# Patient Record
Sex: Female | Born: 1980 | Race: Black or African American | Hispanic: No | Marital: Married | Smoking: Never smoker
Health system: Southern US, Community
[De-identification: ages and names within clinical notes are randomized; demographics above are authoritative.]

## PROBLEM LIST (undated history)

## (undated) ENCOUNTER — Inpatient Hospital Stay (HOSPITAL_COMMUNITY): Payer: Self-pay

## (undated) DIAGNOSIS — R87629 Unspecified abnormal cytological findings in specimens from vagina: Secondary | ICD-10-CM

## (undated) DIAGNOSIS — R011 Cardiac murmur, unspecified: Secondary | ICD-10-CM

## (undated) HISTORY — DX: Cardiac murmur, unspecified: R01.1

## (undated) HISTORY — PX: NEUROFIBROMA EXCISION: SHX2089

## (undated) HISTORY — PX: LEEP: SHX91

## (undated) HISTORY — PX: OTHER SURGICAL HISTORY: SHX169

---

## 2015-09-19 ENCOUNTER — Encounter: Payer: Self-pay | Admitting: Cardiology

## 2015-09-25 ENCOUNTER — Telehealth: Payer: Self-pay | Admitting: Cardiology

## 2015-09-25 NOTE — Telephone Encounter (Signed)
09/25/2015 Received faxed referral packet from [facility] [place] [facility] for upcoming appointment with Dr. Percival Spanish on 09/28/2015.  Records given to [facility].  cbr

## 2015-09-27 NOTE — Progress Notes (Signed)
Cardiology Office Note   Date:  09/28/2015   ID:  Cynthia Day, DOB Sep 20, 1980, MRN [id number]  PCP:  No PCP Per Patient  Cardiologist:   Minus Breeding, MD   Chief Complaint  Patient presents with  . Heart Murmur      History of Present Illness: Cynthia Day is a 35 y.o. female who presents for evaluation of a murmur. She has no past cardiac history. She was noted recently by her GYN have a heart murmur. She's had no prior cardiac workup and has not been told in the past that she has a murmur. She is quite physically active and exercises routinely. With this she denies any cardiovascular symptoms. The patient denies any new symptoms such as chest discomfort, neck or arm discomfort. There has been no new shortness of breath, PND or orthopnea. There have been no reported palpitations, presyncope or syncope.  Past Medical History  Diagnosis Date  . Murmur     Dr. Charlesetta Garibaldi    Past Surgical History  Procedure Laterality Date  . None       No current outpatient prescriptions on file.   No current facility-administered medications for this visit.    Allergies:   Review of patient's allergies indicates no known allergies.    Social History:  The patient  reports that she has never smoked. She does not have any smokeless tobacco history on file. She reports that she drinks alcohol. She reports that she does not use illicit drugs.   Family History:  The patient's family history includes Coronary artery disease (age of onset: 64) in her father; Hypertension in her mother.    ROS:  Please see the history of present illness.   Otherwise, review of systems are positive for none.   All other systems are reviewed and negative.    PHYSICAL EXAM: VS:  BP 100/64 mmHg  Pulse 61  Ht 5\' 7"  (1.702 m)  Wt 152 lb (68.947 kg)  BMI 23.80 kg/m2 , BMI Body mass index is 23.8 kg/(m^2). GENERAL:  Well appearing HEENT:  Pupils equal round and reactive, fundi not visualized, oral mucosa  unremarkable NECK:  No jugular venous distention, waveform within normal limits, carotid upstroke brisk and symmetric, no bruits, no thyromegaly LYMPHATICS:  No cervical, inguinal adenopathy LUNGS:  Clear to auscultation bilaterally BACK:  No CVA tenderness CHEST:  Unremarkable HEART:  PMI not displaced or sustained,S1 and S2 within normal limits, no S3, no S4, no clicks, no rubs, no murmurs ABD:  Flat, positive bowel sounds normal in frequency in pitch, no bruits, no rebound, no guarding, no midline pulsatile mass, no hepatomegaly, no splenomegaly EXT:  2 plus pulses throughout, no edema, no cyanosis no clubbing SKIN:  No rashes no nodules NEURO:  Cranial nerves II through XII grossly intact, motor grossly intact throughout PSYCH:  Cognitively intact, oriented to person place and time    EKG:  EKG is ordered today. The ekg ordered today demonstrates sinus rhythm, rate 61, axis within normal limits, intervals within normal limits, no acute ST-T wave changes.   Recent Labs: No results found for requested labs within last 365 days.    Lipid Panel No results found for: CHOL, TRIG, HDL, CHOLHDL, VLDL, LDLCALC, LDLDIRECT    Wt Readings from Last 3 Encounters:  09/28/15 152 lb (68.947 kg)      Other studies Reviewed: Additional studies/ records that were reviewed today include: Outside office records. Review of the above records demonstrates:  Please see elsewhere  in the note.     ASSESSMENT AND PLAN:  MURMUR:  Today I do not appreciate a murmur.  I suspect that she may have had a flow murmur. She otherwise has a normal exam and no significant historical findings. I would not suggest further cardiovascular testing is indicated.   Current medicines are reviewed at length with the patient today.  The patient does not have concerns regarding medicines.  The following changes have been made:  no change  Labs/ tests ordered today include:   Orders Placed This Encounter    Procedures  . EKG 12-Lead     Disposition:   FU with me as needed.      Signed, Minus Breeding, MD  09/28/2015 12:35 PM    [facility]

## 2015-09-28 ENCOUNTER — Ambulatory Visit (INDEPENDENT_AMBULATORY_CARE_PROVIDER_SITE_OTHER): Payer: 59 | Admitting: Cardiology

## 2015-09-28 ENCOUNTER — Encounter: Payer: Self-pay | Admitting: Cardiology

## 2015-09-28 VITALS — BP 100/64 | HR 61 | Ht 67.0 in | Wt 152.0 lb

## 2015-09-28 DIAGNOSIS — D489 Neoplasm of uncertain behavior, unspecified: Secondary | ICD-10-CM | POA: Diagnosis not present

## 2015-09-28 DIAGNOSIS — R011 Cardiac murmur, unspecified: Secondary | ICD-10-CM

## 2015-09-28 NOTE — Patient Instructions (Signed)
Your physician recommends that you schedule a follow-up appointment in: As Needed    

## 2015-10-05 DIAGNOSIS — D2362 Other benign neoplasm of skin of left upper limb, including shoulder: Secondary | ICD-10-CM | POA: Diagnosis not present

## 2015-10-05 DIAGNOSIS — D3612 Benign neoplasm of peripheral nerves and autonomic nervous system, upper limb, including shoulder: Secondary | ICD-10-CM | POA: Diagnosis not present

## 2015-12-28 DIAGNOSIS — Z0000 Encounter for general adult medical examination without abnormal findings: Secondary | ICD-10-CM | POA: Diagnosis not present

## 2016-01-08 DIAGNOSIS — Z0000 Encounter for general adult medical examination without abnormal findings: Secondary | ICD-10-CM | POA: Diagnosis not present

## 2016-01-25 DIAGNOSIS — Z30432 Encounter for removal of intrauterine contraceptive device: Secondary | ICD-10-CM | POA: Diagnosis not present

## 2016-05-02 DIAGNOSIS — Z01419 Encounter for gynecological examination (general) (routine) without abnormal findings: Secondary | ICD-10-CM | POA: Diagnosis not present

## 2016-05-02 DIAGNOSIS — Z3040 Encounter for surveillance of contraceptives, unspecified: Secondary | ICD-10-CM | POA: Diagnosis not present

## 2017-03-13 DIAGNOSIS — N979 Female infertility, unspecified: Secondary | ICD-10-CM | POA: Diagnosis not present

## 2017-03-13 DIAGNOSIS — Z113 Encounter for screening for infections with a predominantly sexual mode of transmission: Secondary | ICD-10-CM | POA: Diagnosis not present

## 2017-03-14 ENCOUNTER — Ambulatory Visit (HOSPITAL_COMMUNITY)
Admission: RE | Admit: 2017-03-14 | Discharge: 2017-03-14 | Disposition: A | Discharge status: Home / Self Care | Payer: 59 | Source: Ambulatory Visit | Attending: Obstetrics and Gynecology | Admitting: Obstetrics and Gynecology

## 2017-03-14 ENCOUNTER — Other Ambulatory Visit (HOSPITAL_COMMUNITY): Payer: Self-pay | Admitting: Obstetrics and Gynecology

## 2017-03-14 DIAGNOSIS — Z3141 Encounter for fertility testing: Secondary | ICD-10-CM | POA: Diagnosis not present

## 2017-03-14 DIAGNOSIS — N979 Female infertility, unspecified: Secondary | ICD-10-CM | POA: Insufficient documentation

## 2017-03-14 MED ORDER — IOPAMIDOL (ISOVUE-300) INJECTION 61%
30.0000 mL | Freq: Once | INTRAVENOUS | Status: AC | PRN
  Administered 2017-03-14: 4 mL via INTRAVENOUS

## 2017-04-08 NOTE — L&D Delivery Note (Signed)
Delivery Note At 12:52 PM a viable female was delivered via Vaginal, Spontaneous (Presentation: LOA).  APGAR: 9, 9; weight pending .   Placenta status: Normal appearing and grossly intact.  Cord: Normal, with the following complications:none .  Cord pH: not sent.  Anesthesia: Epidural.   Episiotomy: None Lacerations: 1st degree Perineal; 1cm left labial minora.  Suture Repair: 3.0 vicryl Est. Blood Loss (mL): 270  Mom to postpartum.  Baby to Couplet care / Skin to Skin.  Alinda Dooms, MD.  01/26/2018, 1:31 PM

## 2017-06-12 LAB — OB RESULTS CONSOLE GC/CHLAMYDIA
Chlamydia: NEGATIVE
GC PROBE AMP, GENITAL: NEGATIVE

## 2017-07-03 ENCOUNTER — Encounter (HOSPITAL_COMMUNITY): Payer: Self-pay

## 2017-07-03 LAB — OB RESULTS CONSOLE ABO/RH: RH Type: POSITIVE

## 2017-07-03 LAB — OB RESULTS CONSOLE HIV ANTIBODY (ROUTINE TESTING): HIV: NONREACTIVE

## 2017-07-03 LAB — OB RESULTS CONSOLE RUBELLA ANTIBODY, IGM: Rubella: IMMUNE

## 2017-07-03 LAB — OB RESULTS CONSOLE HEPATITIS B SURFACE ANTIGEN: HEP B S AG: NEGATIVE

## 2017-07-03 LAB — OB RESULTS CONSOLE ANTIBODY SCREEN: ANTIBODY SCREEN: NEGATIVE

## 2017-07-17 ENCOUNTER — Other Ambulatory Visit (HOSPITAL_COMMUNITY): Payer: Self-pay | Admitting: Obstetrics and Gynecology

## 2017-07-17 DIAGNOSIS — Z3686 Encounter for antenatal screening for cervical length: Secondary | ICD-10-CM

## 2017-08-11 ENCOUNTER — Encounter (HOSPITAL_COMMUNITY): Payer: Self-pay | Admitting: *Deleted

## 2017-08-12 ENCOUNTER — Encounter (HOSPITAL_COMMUNITY): Payer: Self-pay

## 2017-08-13 ENCOUNTER — Other Ambulatory Visit (HOSPITAL_COMMUNITY): Payer: Self-pay | Admitting: Obstetrics and Gynecology

## 2017-08-13 ENCOUNTER — Encounter (HOSPITAL_COMMUNITY): Payer: Self-pay

## 2017-08-13 ENCOUNTER — Ambulatory Visit (HOSPITAL_COMMUNITY)
Admission: RE | Admit: 2017-08-13 | Discharge: 2017-08-13 | Disposition: A | Discharge status: Home / Self Care | Payer: No Typology Code available for payment source | Source: Ambulatory Visit | Attending: Obstetrics and Gynecology | Admitting: Obstetrics and Gynecology

## 2017-08-13 DIAGNOSIS — Z3A15 15 weeks gestation of pregnancy: Secondary | ICD-10-CM | POA: Diagnosis not present

## 2017-08-13 DIAGNOSIS — Z9889 Other specified postprocedural states: Secondary | ICD-10-CM | POA: Diagnosis not present

## 2017-08-13 DIAGNOSIS — O3442 Maternal care for other abnormalities of cervix, second trimester: Secondary | ICD-10-CM | POA: Insufficient documentation

## 2017-08-13 DIAGNOSIS — O09522 Supervision of elderly multigravida, second trimester: Secondary | ICD-10-CM

## 2017-08-13 DIAGNOSIS — Z3689 Encounter for other specified antenatal screening: Secondary | ICD-10-CM | POA: Insufficient documentation

## 2017-08-13 DIAGNOSIS — Z3686 Encounter for antenatal screening for cervical length: Secondary | ICD-10-CM

## 2017-08-13 HISTORY — DX: Unspecified abnormal cytological findings in specimens from vagina: R87.629

## 2017-08-14 ENCOUNTER — Ambulatory Visit (HOSPITAL_COMMUNITY): Payer: Self-pay

## 2017-10-13 ENCOUNTER — Other Ambulatory Visit: Payer: Self-pay

## 2017-10-13 ENCOUNTER — Inpatient Hospital Stay (HOSPITAL_COMMUNITY)
Admission: AD | Admit: 2017-10-13 | Discharge: 2017-10-14 | Disposition: A | Discharge status: Home / Self Care | Payer: No Typology Code available for payment source | Source: Ambulatory Visit | Attending: Obstetrics & Gynecology | Admitting: Obstetrics & Gynecology

## 2017-10-13 ENCOUNTER — Encounter (HOSPITAL_COMMUNITY): Payer: Self-pay | Admitting: *Deleted

## 2017-10-13 ENCOUNTER — Inpatient Hospital Stay (HOSPITAL_BASED_OUTPATIENT_CLINIC_OR_DEPARTMENT_OTHER): Payer: No Typology Code available for payment source

## 2017-10-13 DIAGNOSIS — Z3A24 24 weeks gestation of pregnancy: Secondary | ICD-10-CM | POA: Insufficient documentation

## 2017-10-13 DIAGNOSIS — O368120 Decreased fetal movements, second trimester, not applicable or unspecified: Secondary | ICD-10-CM | POA: Diagnosis not present

## 2017-10-13 DIAGNOSIS — O368190 Decreased fetal movements, unspecified trimester, not applicable or unspecified: Secondary | ICD-10-CM

## 2017-10-13 LAB — URINALYSIS, ROUTINE W REFLEX MICROSCOPIC
BILIRUBIN URINE: NEGATIVE
Glucose, UA: NEGATIVE mg/dL
HGB URINE DIPSTICK: NEGATIVE
Ketones, ur: NEGATIVE mg/dL
Leukocytes, UA: NEGATIVE
Nitrite: NEGATIVE
PROTEIN: NEGATIVE mg/dL
Specific Gravity, Urine: 1.003 — ABNORMAL LOW (ref 1.005–1.030)
pH: 6 (ref 5.0–8.0)

## 2017-10-13 NOTE — MAU Note (Signed)
LAST TIME FELT BABY MOVE WAS  AT 10PM.IN TRIAGE - FHR- 142.    FEELS UPPER RIGHT ABD PAIN- STARTED THIS AM.  .   CALLED DR - TOLD  TO COME  IN.

## 2017-10-14 NOTE — MAU Provider Note (Signed)
History     CSN: [id number]  Arrival date & time 10/13/17  2249   None     Chief Complaint  Patient presents with  . Decreased Fetal Movement    Cynthia Day 37 y.o. G1P0 @ [redacted]w[redacted]d presents to MAU after being on vacation in new [place] stating that she has been constipated, having upper right abdominal pain and worried about the baby because she has not felt baby move since this morning   Past Medical History:  Diagnosis Date  . Murmur    Dr. Charlesetta Garibaldi  . Vaginal Pap smear, abnormal     Past Surgical History:  Procedure Laterality Date  . LEEP    . none      Family History  Problem Relation Age of Onset  . Coronary artery disease Father 59  . Hypertension Mother     Social History   Tobacco Use  . Smoking status: Never Smoker  . Smokeless tobacco: Never Used  Substance Use Topics  . Alcohol use: Not Currently    Alcohol/week: 0.0 oz  . Drug use: No    OB History    Gravida  1   Para      Term      Preterm      AB      Living  0     SAB      TAB      Ectopic      Multiple      Live Births              Review of Systems  Gastrointestinal: Positive for abdominal pain.  All other systems reviewed and are negative.   Allergies  Patient has no known allergies.  Home Medications    BP 112/70 (BP Location: Right Arm)   Pulse 94   Temp 97.7 F (36.5 C) (Oral)   Resp (!) 22   Ht 5' 7.5" (1.715 m)   Wt 186 lb 4 oz (84.5 kg)   LMP 04/30/2017   BMI 28.74 kg/m   Physical Exam  Constitutional: She is oriented to person, place, and time. She appears well-developed and well-nourished.  HENT:  Head: Normocephalic.  Neck: Normal range of motion.  Cardiovascular: Normal rate.  Pulmonary/Chest: Effort normal and breath sounds normal.  Abdominal: Soft.  Musculoskeletal: Normal range of motion.  Neurological: She is alert and oriented to person, place, and time.  Skin: Skin is warm and dry.  Psychiatric: She has a normal mood and  affect. Her behavior is normal.  Nursing note and vitals reviewed.  FHr present- Preliminary U/S wnl Results for orders placed or performed during the hospital encounter of 10/13/17 (from the past 24 hour(s))  Urinalysis, Routine w reflex microscopic     Status: Abnormal   Collection Time: 10/13/17 11:14 PM  Result Value Ref Range   Color, Urine STRAW (A) YELLOW   APPearance CLEAR CLEAR   Specific Gravity, Urine 1.003 (L) 1.005 - 1.030   pH 6.0 5.0 - 8.0   Glucose, UA NEGATIVE NEGATIVE mg/dL   Hgb urine dipstick NEGATIVE NEGATIVE   Bilirubin Urine NEGATIVE NEGATIVE   Ketones, ur NEGATIVE NEGATIVE mg/dL   Protein, ur NEGATIVE NEGATIVE mg/dL   Nitrite NEGATIVE NEGATIVE   Leukocytes, UA NEGATIVE NEGATIVE   MAU Course  Procedures (including critical care time)  Labs Reviewed  URINALYSIS, ROUTINE W REFLEX MICROSCOPIC - Abnormal; Notable for the following components:      Result Value   Color, Urine STRAW (*)  Specific Gravity, Urine 1.003 (*)    All other components within normal limits   No results found.   1. Decreased fetal movement       Discharge to home F/U at office at next regular scheduled appointment or prn  [place]

## 2017-11-13 LAB — OB RESULTS CONSOLE RPR: RPR: NONREACTIVE

## 2018-01-12 LAB — OB RESULTS CONSOLE GBS: STREP GROUP B AG: POSITIVE

## 2018-01-25 ENCOUNTER — Encounter (HOSPITAL_COMMUNITY): Payer: Self-pay

## 2018-01-25 ENCOUNTER — Other Ambulatory Visit: Payer: Self-pay

## 2018-01-25 ENCOUNTER — Inpatient Hospital Stay (HOSPITAL_COMMUNITY): Payer: No Typology Code available for payment source | Admitting: Anesthesiology

## 2018-01-25 ENCOUNTER — Inpatient Hospital Stay (HOSPITAL_COMMUNITY)
Admission: AD | Admit: 2018-01-25 | Discharge: 2018-01-28 | DRG: 807 | Disposition: A | Discharge status: Home / Self Care | Payer: No Typology Code available for payment source | Attending: Obstetrics & Gynecology | Admitting: Obstetrics & Gynecology

## 2018-01-25 DIAGNOSIS — Z3A38 38 weeks gestation of pregnancy: Secondary | ICD-10-CM

## 2018-01-25 DIAGNOSIS — Z880 Allergy status to penicillin: Secondary | ICD-10-CM

## 2018-01-25 DIAGNOSIS — Z3483 Encounter for supervision of other normal pregnancy, third trimester: Secondary | ICD-10-CM | POA: Diagnosis present

## 2018-01-25 DIAGNOSIS — O99824 Streptococcus B carrier state complicating childbirth: Secondary | ICD-10-CM | POA: Diagnosis present

## 2018-01-25 DIAGNOSIS — O4290 Premature rupture of membranes, unspecified as to length of time between rupture and onset of labor, unspecified weeks of gestation: Secondary | ICD-10-CM

## 2018-01-25 LAB — CBC
HCT: 38.5 % (ref 36.0–46.0)
HEMOGLOBIN: 13 g/dL (ref 12.0–15.0)
MCH: 29.4 pg (ref 26.0–34.0)
MCHC: 33.8 g/dL (ref 30.0–36.0)
MCV: 87.1 fL (ref 80.0–100.0)
Platelets: 252 10*3/uL (ref 150–400)
RBC: 4.42 MIL/uL (ref 3.87–5.11)
RDW: 13.8 % (ref 11.5–15.5)
WBC: 15.3 10*3/uL — ABNORMAL HIGH (ref 4.0–10.5)
nRBC: 0 % (ref 0.0–0.2)

## 2018-01-25 LAB — TYPE AND SCREEN
ABO/RH(D): A POS
Antibody Screen: NEGATIVE

## 2018-01-25 LAB — POCT FERN TEST: POCT FERN TEST: POSITIVE

## 2018-01-25 MED ORDER — PHENYLEPHRINE 40 MCG/ML (10ML) SYRINGE FOR IV PUSH (FOR BLOOD PRESSURE SUPPORT): 80.0000 ug | PREFILLED_SYRINGE | INTRAVENOUS | Status: DC | PRN

## 2018-01-25 MED ORDER — OXYTOCIN 40 UNITS IN LACTATED RINGERS INFUSION - SIMPLE MED
2.5000 [IU]/h | INTRAVENOUS | Status: DC
  Filled 2018-01-25: qty 1000

## 2018-01-25 MED ORDER — OXYCODONE-ACETAMINOPHEN 5-325 MG PO TABS: 1.0000 | ORAL_TABLET | ORAL | Status: DC | PRN

## 2018-01-25 MED ORDER — LACTATED RINGERS IV SOLN
INTRAVENOUS | Status: DC
  Administered 2018-01-25 – 2018-01-26 (×4): via INTRAVENOUS

## 2018-01-25 MED ORDER — LIDOCAINE HCL (PF) 1 % IJ SOLN
INTRAMUSCULAR | Status: DC | PRN
  Administered 2018-01-25: 8 mL via EPIDURAL

## 2018-01-25 MED ORDER — PHENYLEPHRINE 40 MCG/ML (10ML) SYRINGE FOR IV PUSH (FOR BLOOD PRESSURE SUPPORT)
80.0000 ug | PREFILLED_SYRINGE | INTRAVENOUS | Status: DC | PRN
  Filled 2018-01-25: qty 10
  Filled 2018-01-25: qty 5

## 2018-01-25 MED ORDER — ONDANSETRON HCL 4 MG/2ML IJ SOLN: 4.0000 mg | Freq: Four times a day (QID) | INTRAMUSCULAR | Status: DC | PRN

## 2018-01-25 MED ORDER — LACTATED RINGERS IV SOLN: 500.0000 mL | INTRAVENOUS | Status: DC | PRN

## 2018-01-25 MED ORDER — PHENYLEPHRINE 40 MCG/ML (10ML) SYRINGE FOR IV PUSH (FOR BLOOD PRESSURE SUPPORT)
80.0000 ug | PREFILLED_SYRINGE | INTRAVENOUS | Status: DC | PRN
  Administered 2018-01-25 – 2018-01-26 (×2): 80 ug via INTRAVENOUS
  Filled 2018-01-25: qty 10
  Filled 2018-01-25: qty 5

## 2018-01-25 MED ORDER — PENICILLIN G 3 MILLION UNITS IVPB - SIMPLE MED
3.0000 10*6.[IU] | INTRAVENOUS | Status: DC
  Administered 2018-01-26 (×3): 3 10*6.[IU] via INTRAVENOUS
  Filled 2018-01-25 (×12): qty 100

## 2018-01-25 MED ORDER — EPHEDRINE 5 MG/ML INJ: 10.0000 mg | INTRAVENOUS | Status: DC | PRN

## 2018-01-25 MED ORDER — DIPHENHYDRAMINE HCL 50 MG/ML IJ SOLN: 12.5000 mg | INTRAMUSCULAR | Status: DC | PRN

## 2018-01-25 MED ORDER — OXYCODONE-ACETAMINOPHEN 5-325 MG PO TABS: 2.0000 | ORAL_TABLET | ORAL | Status: DC | PRN

## 2018-01-25 MED ORDER — FENTANYL 2.5 MCG/ML BUPIVACAINE 1/10 % EPIDURAL INFUSION (WH - ANES)
14.0000 mL/h | INTRAMUSCULAR | Status: DC | PRN
  Administered 2018-01-25 – 2018-01-26 (×2): 14 mL/h via EPIDURAL
  Filled 2018-01-25 (×2): qty 100

## 2018-01-25 MED ORDER — LACTATED RINGERS IV SOLN
500.0000 mL | Freq: Once | INTRAVENOUS | Status: AC
  Administered 2018-01-25: 500 mL via INTRAVENOUS

## 2018-01-25 MED ORDER — SODIUM CHLORIDE 0.9 % IV SOLN
5.0000 10*6.[IU] | Freq: Once | INTRAVENOUS | Status: AC
  Administered 2018-01-25: 5 10*6.[IU] via INTRAVENOUS
  Filled 2018-01-25: qty 5

## 2018-01-25 MED ORDER — FENTANYL CITRATE (PF) 100 MCG/2ML IJ SOLN: 100.0000 ug | INTRAMUSCULAR | Status: DC | PRN

## 2018-01-25 MED ORDER — SOD CITRATE-CITRIC ACID 500-334 MG/5ML PO SOLN: 30.0000 mL | ORAL | Status: DC | PRN

## 2018-01-25 MED ORDER — EPHEDRINE 5 MG/ML INJ
10.0000 mg | INTRAVENOUS | Status: DC | PRN
  Filled 2018-01-25: qty 2

## 2018-01-25 MED ORDER — OXYTOCIN BOLUS FROM INFUSION
500.0000 mL | Freq: Once | INTRAVENOUS | Status: AC
  Administered 2018-01-26: 500 mL via INTRAVENOUS

## 2018-01-25 MED ORDER — LIDOCAINE HCL (PF) 1 % IJ SOLN
30.0000 mL | INTRAMUSCULAR | Status: DC | PRN
  Filled 2018-01-25: qty 30

## 2018-01-25 MED ORDER — ACETAMINOPHEN 325 MG PO TABS: 650.0000 mg | ORAL_TABLET | ORAL | Status: DC | PRN

## 2018-01-25 MED ORDER — FENTANYL 2.5 MCG/ML BUPIVACAINE 1/10 % EPIDURAL INFUSION (WH - ANES): 14.0000 mL/h | INTRAMUSCULAR | Status: DC | PRN

## 2018-01-25 MED ORDER — LACTATED RINGERS IV SOLN: 500.0000 mL | Freq: Once | INTRAVENOUS | Status: DC

## 2018-01-25 NOTE — Anesthesia Preprocedure Evaluation (Signed)

## 2018-01-25 NOTE — H&P (Signed)
Cynthia Day is a 37 y.o. female, G1P0000, IUP at 38.5 weeks, presenting for Spontaneous latent labor with SROM, clear. She presents to MAU today complaining of leaking of fluid since 1630. She endorses vaginal bleeding, noting bloody show earlier today. She endorses contractions. Pt endorse + Fm. GBS+. Uneventful pregnancy.   There are no active problems to display for this patient. Pregnancy Problems Group B Streptococcus carrier advanced maternal age gravida history of loop electrosurgical excision procedure  Medications Flonase melatonin Prenatal Vitamin Zyrtec  Medications Prior to Admission  Medication Sig Dispense Refill Last Dose  . Prenatal Vit-Fe Fumarate-FA (PRENATAL VITAMIN PO) Take by mouth.   Past Week at Unknown time    Past Medical History:  Diagnosis Date  . Murmur    Dr. Charlesetta Day  . Vaginal Pap smear, abnormal      No current facility-administered medications on file prior to encounter.    Current Outpatient Medications on File Prior to Encounter  Medication Sig Dispense Refill  . Prenatal Vit-Fe Fumarate-FA (PRENATAL VITAMIN PO) Take by mouth.       No Known Allergies  History of present pregnancy: Pt Info/Preference:  Screening/Consents:  Labs:   EDD: Estimated Date of Delivery: 02/03/18  Establised: Patient's last menstrual period was 04/30/2017.  Anatomy Scan: Date: 09/18/2017 Placenta Location: posterior Genetic Screen: Panoroma:Normal AFP:  First Tri: Quad:  Office: [facility]       First PNV: 11.2 wg Blood Type    Language: english Last PNV: 38.2 wg Rhogam    Flu Vaccine:  UTD   Antibody    TDaP vaccine UTD   GTT: Early: N/A Third Trimester: Normal  Feeding Plan: Breast BTL: No Rubella:    Contraception: ??? VBAC: No RPR:     Circumcision: No-Baby Female   HBsAg:    Pediatrician:  ???   HIV:     Prenatal Classes: No Additional [place]: No GBS:  (For PCN allergy, check sensitivities)       Chlamydia: Neg    MFM Referral/Consult:  GC: Neg  Support  Person: Husband   PAP: 2019-normal  Pain Management: Epidural Neonatologist Referral:  Hgb Electrophoresis:  N/A  Birth Plan: None   Hgb NOB: 13.4    28W: 12.7   OB History    Gravida  1   Para      Term      Preterm      AB      Living  0     SAB      TAB      Ectopic      Multiple      Live Births             Past Medical History:  Diagnosis Date  . Murmur    Dr. Charlesetta Day  . Vaginal Pap smear, abnormal    Past Surgical History:  Procedure Laterality Date  . LEEP    . none     Family History: family history includes Coronary artery disease (age of onset: 19) in her father; Hypertension in her mother. Social History:  reports that she has never smoked. She has never used smokeless tobacco. She reports that she drank alcohol. She reports that she does not use drugs.   Prenatal Transfer Tool  Maternal Diabetes: No Genetic Screening: Normal Maternal Ultrasounds/Referrals: Normal Fetal Ultrasounds or other Referrals:  None Maternal Substance Abuse:  No Significant Maternal Medications:  None Significant Maternal Lab Results: Lab values include: Group B Strep positive  ROS:  Review  of Systems  All other systems reviewed and are negative.    Physical Exam: BP 118/74 (BP Location: Right Arm)   Pulse 93   Temp 97.8 F (36.6 C) (Oral)   Resp 18   Wt 86.9 kg   LMP 04/30/2017   BMI 29.55 kg/m   Physical Exam  Constitutional: She is well-developed, well-nourished, and in no distress.  HENT:  Head: Normocephalic and atraumatic.  Eyes: Pupils are equal, round, and reactive to light. Conjunctivae are normal.  Neck: Normal range of motion. Neck supple.  Cardiovascular: Normal rate and regular rhythm.  Pulmonary/Chest: Effort normal and breath sounds normal.  Abdominal: Soft. Bowel sounds are normal.  Genitourinary: Vagina normal and cervix normal.  Genitourinary Comments: Speculum exam performed by MAU provider.  Uterus soft non-tender, gravida equal  to dates Pelvis: adequate for vaginal delivery.   Nursing note and vitals reviewed.    NST: FHR baseline 140 bpm, Variability: moderate, Accelerations:present, Decelerations:  Absent= Cat 1/Reactive UC:   regular, every 2.5-3 minutes, lasting 60-90 seconds SVE:   Dilation: 1.5 Effacement (%): 60 Station: -3 Exam by:: Cynthia Guild NP , vertex verified by fetal sutures.  Leopold's: Position vertex, EFW 7.5lbs via leopold's.   Labs: Results for orders placed or performed during the hospital encounter of 01/25/18 (from the past 24 hour(s))  POCT fern test     Status: None   Collection Time: 01/25/18  5:36 PM  Result Value Ref Range   POCT Fern Test Positive = ruptured amniotic membanes     Imaging:  No results found.  MAU Course: Orders Placed This Encounter  Procedures  . Contraction - monitoring  . External fetal heart monitoring  . Vaginal exam  . POCT fern test   No orders of the defined types were placed in this encounter.   Assessment/Plan: Cynthia Day is a 37 y.o. female, G1P0000, IUP at 38.5 weeks, presenting for early labor with SROM @ 1630, clear fluids today. GBS+. Pt stable. Uneventful pregnancy.   FWB: Cat 1 Fetal Tracing.   Plan: Admit to [place] per consult with Dr Cynthia Day Routine [facility] orders Pain med/epidural prn PCN G for GBS prophylaxis  Anticipate labor progression   Cynthia Schiller NP-C, [name], [name] 01/25/2018, 5:43 PM

## 2018-01-25 NOTE — MAU Note (Signed)
Ctx today, reports every 8 min, some bloody show, ? SROM, +FM

## 2018-01-25 NOTE — Anesthesia Procedure Notes (Signed)
Epidural Patient location during procedure: OB Start time: 01/25/2018 7:32 PM End time: 01/25/2018 7:39 PM  Staffing Anesthesiologist: Janeece Riggers, MD  Preanesthetic Checklist Completed: patient identified, site marked, surgical consent, pre-op evaluation, timeout performed, IV checked, risks and benefits discussed and monitors and equipment checked  Epidural Patient position: sitting Prep: site prepped and draped and DuraPrep Patient monitoring: continuous pulse ox and blood pressure Approach: midline Location: L4-L5 Injection technique: LOR air  Needle:  Needle type: Tuohy  Needle gauge: 17 G Needle length: 9 cm and 9 Needle insertion depth: 7 cm Catheter type: closed end flexible Catheter size: 19 Gauge Catheter at skin depth: 12 cm Test dose: negative  Assessment Events: blood not aspirated, injection not painful, no injection resistance, negative IV test and no paresthesia

## 2018-01-25 NOTE — MAU Provider Note (Signed)
S: Cynthia Day is a 37 y.o. G1P0 at [redacted]w[redacted]d  who presents to MAU today complaining of leaking of fluid since 1630. She endorses vaginal bleeding, noting bloody show earlier today. She endorses contractions. She reports normal fetal movement.    O: BP 118/74 (BP Location: Right Arm)   Pulse 93   Temp 97.8 F (36.6 C) (Oral)   Resp 18   Wt 86.9 kg   LMP 04/30/2017   BMI 29.55 kg/m  GENERAL: Well-developed, well-nourished female in no acute distress. Patient visibly uncomfortable & breathing through contractions.  HEAD: Normocephalic, atraumatic.  CHEST: Normal effort of breathing, regular heart rate ABDOMEN: Soft, nontender, gravid PELVIC: Normal external female genitalia. Vagina is pink and rugated. Cervix with normal contour, no lesions. Normal discharge.  Positive pooling.   Cervical exam:  Dilation: 1.5 Effacement (%): 60 Cervical Position: Posterior Station: -3 Presentation: Vertex Exam by:: Jorje Guild NP    Fetal Monitoring: Baseline: 135 Variability: moderate Accelerations: 15x15 Decelerations: none Contractions: Q3-4 minutes  Results for orders placed or performed during the hospital encounter of 01/25/18 (from the past 24 hour(s))  POCT fern test     Status: None   Collection Time: 01/25/18  5:36 PM  Result Value Ref Range   POCT Fern Test Positive = ruptured amniotic membanes      A: SIUP at [redacted]w[redacted]d  SROM  P: Report given to RN to contact MD on call for further instructions  Jorje Guild, NP 01/25/2018 5:36 PM

## 2018-01-26 LAB — RPR: RPR Ser Ql: NONREACTIVE

## 2018-01-26 LAB — ABO/RH: ABO/RH(D): A POS

## 2018-01-26 MED ORDER — ONDANSETRON HCL 4 MG PO TABS: 4.0000 mg | ORAL_TABLET | ORAL | Status: DC | PRN

## 2018-01-26 MED ORDER — FLEET ENEMA 7-19 GM/118ML RE ENEM: 1.0000 | ENEMA | Freq: Every day | RECTAL | Status: DC | PRN

## 2018-01-26 MED ORDER — OXYTOCIN 40 UNITS IN LACTATED RINGERS INFUSION - SIMPLE MED
1.0000 m[IU]/min | INTRAVENOUS | Status: DC
  Administered 2018-01-26: 1 m[IU]/min via INTRAVENOUS

## 2018-01-26 MED ORDER — OXYCODONE-ACETAMINOPHEN 5-325 MG PO TABS: 1.0000 | ORAL_TABLET | ORAL | Status: DC | PRN

## 2018-01-26 MED ORDER — COCONUT OIL OIL
1.0000 "application " | TOPICAL_OIL | Status: DC | PRN
  Administered 2018-01-27: 1 via TOPICAL
  Filled 2018-01-26: qty 120

## 2018-01-26 MED ORDER — ACETAMINOPHEN 325 MG PO TABS: 650.0000 mg | ORAL_TABLET | ORAL | Status: DC | PRN

## 2018-01-26 MED ORDER — PRENATAL MULTIVITAMIN CH
1.0000 | ORAL_TABLET | Freq: Every day | ORAL | Status: DC
  Filled 2018-01-26: qty 1

## 2018-01-26 MED ORDER — IBUPROFEN 600 MG PO TABS
600.0000 mg | ORAL_TABLET | Freq: Four times a day (QID) | ORAL | Status: DC
  Administered 2018-01-26 – 2018-01-28 (×7): 600 mg via ORAL
  Filled 2018-01-26 (×8): qty 1

## 2018-01-26 MED ORDER — SENNOSIDES-DOCUSATE SODIUM 8.6-50 MG PO TABS
2.0000 | ORAL_TABLET | ORAL | Status: DC
  Administered 2018-01-26 – 2018-01-27 (×2): 2 via ORAL
  Filled 2018-01-26 (×3): qty 2

## 2018-01-26 MED ORDER — ZOLPIDEM TARTRATE 5 MG PO TABS: 5.0000 mg | ORAL_TABLET | Freq: Every evening | ORAL | Status: DC | PRN

## 2018-01-26 MED ORDER — SIMETHICONE 80 MG PO CHEW: 80.0000 mg | CHEWABLE_TABLET | ORAL | Status: DC | PRN

## 2018-01-26 MED ORDER — DIBUCAINE 1 % RE OINT: 1.0000 "application " | TOPICAL_OINTMENT | RECTAL | Status: DC | PRN

## 2018-01-26 MED ORDER — BISACODYL 10 MG RE SUPP: 10.0000 mg | Freq: Every day | RECTAL | Status: DC | PRN

## 2018-01-26 MED ORDER — BENZOCAINE-MENTHOL 20-0.5 % EX AERO
1.0000 "application " | INHALATION_SPRAY | CUTANEOUS | Status: DC | PRN
  Administered 2018-01-26: 1 via TOPICAL
  Filled 2018-01-26: qty 56

## 2018-01-26 MED ORDER — OXYCODONE-ACETAMINOPHEN 5-325 MG PO TABS: 2.0000 | ORAL_TABLET | ORAL | Status: DC | PRN

## 2018-01-26 MED ORDER — DIPHENHYDRAMINE HCL 25 MG PO CAPS: 25.0000 mg | ORAL_CAPSULE | Freq: Four times a day (QID) | ORAL | Status: DC | PRN

## 2018-01-26 MED ORDER — WITCH HAZEL-GLYCERIN EX PADS: 1.0000 "application " | MEDICATED_PAD | CUTANEOUS | Status: DC | PRN

## 2018-01-26 MED ORDER — TERBUTALINE SULFATE 1 MG/ML IJ SOLN
0.2500 mg | Freq: Once | INTRAMUSCULAR | Status: DC | PRN
  Filled 2018-01-26: qty 1

## 2018-01-26 MED ORDER — ONDANSETRON HCL 4 MG/2ML IJ SOLN: 4.0000 mg | INTRAMUSCULAR | Status: DC | PRN

## 2018-01-26 NOTE — Progress Notes (Signed)
Labor Progress Note  Cynthia Day is a 37 y.o. female, G1P0000, IUP at 38.5 weeks, presenting for Spontaneous latent labor with SROM, clear. She presents to MAU today complaining of leaking of fluid since 1630. Sheendorsesvaginal bleeding, noting bloody show earlier today. Sheendorsescontractions. Pt endorse + Fm. GBS+. Uneventful pregnancy  Subjective: Pt resting in bed in NAD. RN in room checking pt.   Patient Active Problem List   Diagnosis Date Noted  . Normal labor and delivery 01/25/2018   Objective: BP (!) 96/52   Pulse 100   Temp 98 F (36.7 C) (Oral)   Resp 18   Ht 5\' 7"  (1.702 m)   Wt 86.9 kg   LMP 04/30/2017   SpO2 99%   BMI 29.99 kg/m  No intake/output data recorded. No intake/output data recorded. NST: FHR baseline 140 bpm, Variability: moderate, Accelerations:present, Decelerations:  Absent= Cat 1/Reactive CTX:  regular, every 3-4 minutes, lasting 60-80 seconds. Uterus gravid, soft non tender, moderate to palpate with contractions.  SVE:  Dilation: 2.5 Effacement (%): 80 Station: -2 Exam by:: Jack Quarto, RN Pitocin at ( Not started) mUn/min  Assessment:  Cynthia Day is a 37 y.o. female, G1P0000, IUP at 38.5 weeks, presenting for Spontaneous latent labor with SROM, clear. She presents to MAU today complaining of leaking of fluid since 1630. Sheendorsesvaginal bleeding, noting bloody show earlier today. Sheendorsescontractions. Pt endorse + Fm. GBS+. Uneventful pregnancy. Pt resting  and comfortable.  Patient Active Problem List   Diagnosis Date Noted  . Normal labor and delivery 01/25/2018   NICHD: Category 1  Membranes:  SROM @ 0865, clear on 10/20x 8hrs, no s/s of infection  Augmention:   Pitocin - (Not started yet)  Pain management:               Epidural placement:  at 2030 on 10/20  GBS Positive  Abx: Penicillin x1@ 1943 on 10/20, 2nd dose @ 0003 on 10/21  Plan: Continue labor plan Continuous/intermittent monitoring Continue  Penicillin for GBS coverage. Rest/Frequent position changes to facilitate fetal rotation and descent. Will reassess with cervical exam at 1200or earlier if necessary, attempt to decrease cervical checks to decrease transmission of infection. Plan to start pitocin  per protocol 1X1 if contraction space or no cervical change is made. Anticipate labor progression and vaginal delivery.   Noralyn Pick, NP-C, [name], [name] 01/26/2018. 1:13 AM [place]

## 2018-01-26 NOTE — Progress Notes (Signed)
Labor Progress Note  Cynthia Day is a 37 y.o. female, G1P0000, IUP at 38.5 weeks, presenting for Spontaneous latent labor with SROM, clear. She presents to MAU today complaining of leaking of fluid since 1630. Sheendorsesvaginal bleeding, noting bloody show earlier today. Sheendorsescontractions. Pt endorse + Fm. GBS+. Uneventful pregnancy  Subjective: Pt resting in bed post epidural placement and stable.   RN reported slight variables post epidural placement, pt repositioned and resolved, Dr Charlesetta Garibaldi aware.  Patient Active Problem List   Diagnosis Date Noted  . Normal labor and delivery 01/25/2018   Objective: BP (!) 96/52   Pulse 100   Temp 98 F (36.7 C) (Oral)   Resp 18   Ht 5\' 7"  (1.702 m)   Wt 86.9 kg   LMP 04/30/2017   SpO2 99%   BMI 29.99 kg/m  No intake/output data recorded. No intake/output data recorded. NST: FHR baseline 140 bpm, Variability: moderate, Accelerations:present, Decelerations:  Absent= Cat 1/Reactive CTX:  regular, every 3-4 minutes, lasting 60-80 seconds. Uterus gravid, soft non tender, moderate to palpate with contractions.  SVE:  Dilation: 2.5 Effacement (%): 80 Station: -2 Exam by:: Jack Quarto, RN Pitocin at ( Not started) mUn/min  Assessment:  Cynthia Day is a 37 y.o. female, G1P0000, IUP at 38.5 weeks, presenting for Spontaneous latent labor with SROM, clear. She presents to MAU today complaining of leaking of fluid since 1630. Sheendorsesvaginal bleeding, noting bloody show earlier today. Sheendorsescontractions. Pt endorse + Fm. GBS+. Uneventful pregnancy. Pt resting post epidural placement and comfortable.  Patient Active Problem List   Diagnosis Date Noted  . Normal labor and delivery 01/25/2018   NICHD: Category 1  Membranes:  SROM @ 3212, clear on 10/20x 5hrs, no s/s of infection  Augmention:   Pitocin - (Not started yet)  Pain management:               Epidural placement:  at 2030 on 10/20  GBS Positive  Abx:  Penicillin x1  Plan: Continue labor plan Continuous/intermittent monitoring Continue Penicillin for GBS coverage. Rest/Frequent position changes to facilitate fetal rotation and descent. Will reassess with cervical exam at 1200or earlier if necessary, attempt to decrease cervical checks to decrease transmission of infection. Anticipate labor progression and vaginal delivery.   Md Dillard in room.    Noralyn Pick, NP-C, [name], [name] 01/25/2018. 9:00PM

## 2018-01-26 NOTE — Lactation Note (Signed)
This note was copied from a baby's chart. Lactation Consultation Note  Patient Name: Cynthia Day Today's Date: 01/26/2018 Reason for consult: Initial assessment;Primapara;1st time breastfeeding;Early term 37-38.6wks  P1 mother whose infant is now 55 hours old. 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  Offered to assist with latching and mother accepted.  Positioned mother appropriately and latched in the football hold on the right breast without difficulty.  After a couple of minutes baby began rhythmic sucking and mother denied pain with latching.  Showed mother how to do breast compressions and hand expression.  Colostrum container provided for any EBM she may obtain with hand expression.  Encouraged to feed 8-12 times/24 hours or sooner if baby shows feeding cues.  Mother will call for assistance as needed.  [place] given and paperwork put in folder in clean utility room.  Mom made aware of O/P services, breastfeeding support groups, community resources, and our phone # for post-discharge questions. Father and visitors present.     Maternal Data Formula Feeding for Exclusion: No Has patient been taught Hand Expression?: Yes Does the patient have breastfeeding experience prior to this delivery?: No  Feeding Feeding Type: Breast Fed  LATCH Score Latch: Grasps breast easily, tongue down, lips flanged, rhythmical sucking.  Audible Swallowing: A few with stimulation  Type of Nipple: Everted at rest and after stimulation  Comfort (Breast/Nipple): Soft / non-tender  Hold (Positioning): Assistance needed to correctly position infant at breast and maintain latch.  LATCH Score: 8  Interventions Interventions: Breast feeding basics reviewed;Assisted with latch;Skin to skin;Breast massage;Hand express;Position options;Support pillows;Adjust position;Breast compression  Lactation Tools Discussed/Used WIC Program: No   Consult Status Consult  Status: Follow-up Date: 01/27/18 Follow-up type: In-patient    Little Ishikawa 01/26/2018, 6:37 PM

## 2018-01-26 NOTE — Anesthesia Postprocedure Evaluation (Signed)
Anesthesia Post Note  Patient: Cynthia Day  Procedure(s) Performed: AN AD [place]     Patient location during evaluation: Mother Baby Anesthesia Type: Epidural Level of consciousness: awake and alert and oriented Pain management: satisfactory to patient Vital Signs Assessment: post-procedure vital signs reviewed and stable Respiratory status: respiratory function stable Cardiovascular status: stable Postop Assessment: no headache, no backache, epidural receding, patient able to bend at knees, no signs of nausea or vomiting and adequate PO intake Anesthetic complications: no    Last Vitals:  Vitals:   01/26/18 1445 01/26/18 1550  BP: 130/88 128/84  Pulse: (!) 109 96  Resp: 18 20  Temp: 36.8 C 36.6 C  SpO2:      Last Pain:  Vitals:   01/26/18 1615  TempSrc:   PainSc: 0-No pain   Pain Goal: Patients Stated Pain Goal: 5 (01/25/18 1847)               Katherina Mires

## 2018-01-26 NOTE — Progress Notes (Signed)
Labor Progress Note  Cynthia Day is a 37 y.o. female, G1P0000, IUP at 38.5 weeks, presenting for Spontaneous latent labor with SROM, clear. She presents to MAU today complaining of leaking of fluid since 1630. Sheendorsesvaginal bleeding, noting bloody show earlier today. Sheendorsescontractions. Pt endorse + Fm. GBS+. Uneventful pregnancy  Subjective: Pt resting in bed in NAD. RN in room checking pt.  Pt requested pitocin as an augmentation. Strip was looking a little flat, Bolus given.  Patient Active Problem List   Diagnosis Date Noted  . Normal labor and delivery 01/25/2018   Objective: BP 108/67   Pulse 86   Temp 98.3 F (36.8 C) (Oral)   Resp 16   Ht 5\' 7"  (1.702 m)   Wt 86.9 kg   LMP 04/30/2017   SpO2 99%   BMI 29.99 kg/m  No intake/output data recorded. No intake/output data recorded. NST: FHR baseline 130 bpm, Variability: moderate, Accelerations:present 10X10, Decelerations: x1 small variable= Cat 1/Reactive CTX:  regular, every 3-4 minutes, lasting 60 seconds. Uterus gravid, soft non tender, moderate to palpate with contractions.  SVE:  Dilation: 2.5 Effacement (%): 80 Station: -2 Exam by:: Jack Quarto, RN Pitocin at (started @1 ) mUn/min  I discussed with patient risks, benefits and alternatives of labor augmention including higher risk of cesarean delivery compared to spontaneous labor.  We discussed risks of induction agents including effects on fetal heart beat, contraction pattern and need for close monitoring.  Patient expressed understanding of all this and desired to proceed with the augmention. Risks and benefits of augmenting were reviewed, including failure of method, prolonged labor, need for further intervention, risk of cesarean.  Patient and family verbalized understanding and denies any further questions at this time. Pt and family wish to proceed with augmenting process.   Assessment:  Cynthia Day is a 37 y.o. female, G1P0000, IUP at 38.5  weeks, presenting for Spontaneous latent labor with SROM, clear. She presents to MAU today complaining of leaking of fluid since 1630. Sheendorsesvaginal bleeding, noting bloody show earlier today. Sheendorsescontractions. Pt endorse + Fm. GBS+. Uneventful pregnancy. Pt resting  and comfortable. Pt requested pitocin for augmenting due to no cervical change in 6 hours.  Patient Active Problem List   Diagnosis Date Noted  . Normal labor and delivery 01/25/2018   NICHD: Category 1  Membranes:  SROM @ 2440, clear on 10/20x 10hrs, no s/s of infection  Augmention:   Pitocin - (started at 1)  Pain management:               Epidural placement:  at 2030 on 10/20  GBS Positive  Abx: Penicillin x1@ 1943 on 10/20, 2nd dose @ 0003 on 10/21  Plan: Continue labor plan Continuous/intermittent monitoring Continue Penicillin for GBS coverage. Rest/Frequent position changes to facilitate fetal rotation and descent. Will reassess with cervical exam at 0600 or earlier if necessary, attempt to decrease cervical checks to decrease transmission of infection. Start pitocin  per protocol 1X1 . Anticipate labor progression and vaginal delivery.   Noralyn Pick, NP-C, [name], [name] 01/26/2018. 2:12 AM Late Entry

## 2018-01-26 NOTE — Progress Notes (Signed)
Cynthia Day is a 37 y.o. G1P0 at [redacted]w[redacted]d in labor  Subjective: Patient feels some pelvic pressure, but is tolerable.   Objective: BP 120/67   Pulse 97   Temp (!) 97.5 F (36.4 C) (Oral)   Resp 18   Ht 5\' 7"  (1.702 m)   Wt 86.9 kg   LMP 04/30/2017   SpO2 99%   BMI 29.99 kg/m  No intake/output data recorded. Total I/O In: -  Out: 3000 [Urine:3000]  FHT:  FHR: 120 bpm, variability: moderate,  accelerations:  Present,  decelerations:  Absent UC:   regular, every 2 to 3 minutes SVE:   Dilation: 10 Effacement (%): 100 Station: Plus 1 Exam by:: Sandy Haye EFW by Le[place] 7lbs.   Labs: Lab Results  Component Value Date   WBC 15.3 (H) 01/25/2018   HGB 13.0 01/25/2018   HCT 38.5 01/25/2018   MCV 87.1 01/25/2018   PLT 252 01/25/2018    Assessment / Plan: Augmentation of labor, progressing well  Labor: Progressing normally.  Allow laboring down for 1 hr then attempt pushing or sooner.  Preeclampsia:  None Fetal Wellbeing:  Category I Pain Control:  Epidural I/D:  GBS positive on Penicillin Anticipated MOD:  NSVD  Alinda Dooms, MD.  01/26/2018, 9:04 AM

## 2018-01-26 NOTE — Progress Notes (Signed)
[place] with Dr Alesia Richards regarding station of +1 and patient's ineffectiveness of pushing because she does not feel urge to push with contractions.  MD stated to titrate pitocin as needed, and labor down for another hour.  Will call anesthesia MD to determine if able to decrease rate of epidural infusion.

## 2018-01-26 NOTE — Progress Notes (Signed)
Labor Progress Note  Cynthia Day is a 37 y.o. female, G1P0000, IUP at 38.5 weeks, presenting for Spontaneous latent labor with SROM, clear. She presents to MAU today complaining of leaking of fluid since 1630. Sheendorsesvaginal bleeding, noting bloody show earlier today. Sheendorsescontractions. Pt endorse + Fm. GBS+. Uneventful pregnancy  Subjective: Pt resting in bed in NAD. Dr Charlesetta Garibaldi in room performing cervical exam.  Patient Active Problem List   Diagnosis Date Noted  . Normal labor and delivery 01/25/2018   Objective: BP 115/69   Pulse 93   Temp 98 F (36.7 C) (Oral)   Resp 18   Ht 5\' 7"  (1.702 m)   Wt 86.9 kg   LMP 04/30/2017   SpO2 99%   BMI 29.99 kg/m  No intake/output data recorded. No intake/output data recorded. NST: FHR baseline 130 bpm, Variability: moderate, Accelerations:present, Decelerations: absent= Cat 1/Reactive CTX:  regular, every 2-4 minutes, lasting 60-120 seconds. Uterus gravid, soft non tender, moderate to palpate with contractions.  SVE:  Dilation: 5.5 Effacement (%): 100 Station: -1 Exam by:: Dr. Charlesetta Garibaldi Pitocin at (4) mUn/min  Assessment:  Tylisha Willow is a 37 y.o. female, G1P0000, IUP at 38.5 weeks, presenting for Spontaneous latent labor with SROM, clear. She presents to MAU today complaining of leaking of fluid since 1630. Sheendorsesvaginal bleeding, noting bloody show earlier today. Sheendorsescontractions. Pt endorse + Fm. GBS+. Uneventful pregnancy. Pt resting  and comfortable.Dr Charlesetta Garibaldi in room assessing. Pt progressing well and still comfortable with epidural  Patient Active Problem List   Diagnosis Date Noted  . Normal labor and delivery 01/25/2018   NICHD: Category 1  Membranes:  SROM @ 6720, clear on 10/20x 14hrs, no s/s of infection  Augmention:   Pitocin - (4)  Pain management:               Epidural placement:  at 2030 on 10/20  GBS Positive  Abx: Penicillin x1@ 1943 on 10/20, 2nd dose @ 0003 on  10/21  Plan: Continue labor plan Continuous/intermittent monitoring Continue Penicillin for GBS coverage. Rest/Frequent position changes to facilitate fetal rotation and descent. Will reassess with cervical exam when necessary, attempt to decrease cervical checks to decrease transmission of infection. Continue pitocin  per protocol 1X1 . Anticipate labor progression and vaginal delivery.   Noralyn Pick, NP-C, [name], [name] 01/26/2018. 6:39 AM Late Entry

## 2018-01-27 LAB — CBC
HCT: 33.3 % — ABNORMAL LOW (ref 36.0–46.0)
Hemoglobin: 11.3 g/dL — ABNORMAL LOW (ref 12.0–15.0)
MCH: 29.4 pg (ref 26.0–34.0)
MCHC: 33.9 g/dL (ref 30.0–36.0)
MCV: 86.7 fL (ref 80.0–100.0)
PLATELETS: 210 10*3/uL (ref 150–400)
RBC: 3.84 MIL/uL — AB (ref 3.87–5.11)
RDW: 14 % (ref 11.5–15.5)
WBC: 18.5 10*3/uL — AB (ref 4.0–10.5)

## 2018-01-27 NOTE — Progress Notes (Signed)
Subjective: Postpartum Day 1: Vaginal delivery, 1st  laceration Patient up ad lib, reports no syncope or dizziness. Feeding:  breastfeeding Contraceptive plan:  NFP  Objective: Vital signs in last 24 hours: Temp:  [97.6 F (36.4 C)-98.4 F (36.9 C)] 98.1 F (36.7 C) (10/22 0100) Pulse Rate:  [86-126] 86 (10/22 0516) Resp:  [16-20] 16 (10/22 0516) BP: (91-130)/(57-89) 91/57 (10/22 0516)  Physical Exam:  General: alert, cooperative and no distress Lochia: appropriate Uterine Fundus: firm Perineum: healing well DVT Evaluation: No evidence of DVT seen on physical exam.   CBC Latest Ref Rng & Units 01/27/2018 01/25/2018  WBC 4.0 - 10.5 K/uL 18.5(H) 15.3(H)  Hemoglobin 12.0 - 15.0 g/dL 11.3(L) 13.0  Hematocrit 36.0 - 46.0 % 33.3(L) 38.5  Platelets 150 - 400 K/uL 210 252     Assessment/Plan: Status post vaginal delivery day 1. Stable Continue current care. Plan for discharge tomorrow, Breastfeeding and Lactation consult    Pleas Koch ProtheroCNM 01/27/2018, 10:02 AM

## 2018-01-27 NOTE — Lactation Note (Signed)
This note was copied from a baby's chart. Lactation Consultation Note: Mom called to assist with latch. Reports pain when latching to left breast. MD has just finished exam. Assisted with latch in football position. Mom reports she has been using cradle hold mostly on that breast. Mom reports pain of score of 7 with initial latch then eases to a 5. Reviewed keeping baby close to the breast and supporting the breast throughout the feeding. Encouraged to rub EBM into nipples after nursing. Encouragement given. No further questions at present. To call for assist prn. Has Medela pump for home - (Is [employer] employee) and wants to put it together and learn how to use it before DC.  Patient Name: Cynthia Day DXAJO'I Date: 01/27/2018 Reason for consult: Follow-up assessment   Maternal Data Formula Feeding for Exclusion: No Has patient been taught Hand Expression?: Yes Does the patient have breastfeeding experience prior to this delivery?: No  Feeding Feeding Type: Breast Fed  LATCH Score Latch: Grasps breast easily, tongue down, lips flanged, rhythmical sucking.  Audible Swallowing: A few with stimulation  Type of Nipple: Everted at rest and after stimulation  Comfort (Breast/Nipple): Filling, red/small blisters or bruises, mild/mod discomfort  Hold (Positioning): Assistance needed to correctly position infant at breast and maintain latch.  LATCH Score: 7  Interventions Interventions: Breast feeding basics reviewed;Support pillows;Position options;Hand express  Lactation Tools Discussed/Used WIC Program: No   Consult Status Consult Status: Follow-up Date: 01/28/18 Follow-up type: In-patient    Truddie Crumble 01/27/2018, 9:01 AM

## 2018-01-27 NOTE — Plan of Care (Signed)
Patient is progressing well, pain is stable with common soreness with activities and cramping with breastfeeding.  Encouraged mom to void each time before she nurses to help with bleeding and cramping.

## 2018-01-27 NOTE — Plan of Care (Signed)
Pts. Condition will continue to improve 

## 2018-01-28 MED ORDER — IBUPROFEN 600 MG PO TABS: 600.0000 mg | ORAL_TABLET | Freq: Four times a day (QID) | ORAL | 1 refills | Status: DC

## 2018-01-28 NOTE — Lactation Note (Signed)
This note was copied from a baby's chart. Lactation Consultation Note  Patient Name: Cynthia Day XENMM'H Date: 01/28/2018 Reason for consult: Follow-up assessment;Nipple pain/trauma Baby is 27 hours old and at a 7% weight loss.  Baby is currently on the breast in football hold with good depth.  Swallows identified.  Mom still c/o nipple tenderness but not any worse.  Nipples intact and mom using coconut oil and breast shells.  Discussed milk coming to volume and the prevention and treatment of engorgement.  Answered questions.  Lactation outpatient services and support reviewed and encouraged prn.  Maternal Data    Feeding    LATCH Score                   Interventions    Lactation Tools Discussed/Used     Consult Status Consult Status: Complete Follow-up type: Call as needed    Ave Filter 01/28/2018, 10:02 AM

## 2018-01-28 NOTE — Progress Notes (Signed)
Post Partum Day 2 Subjective: no complaints, up ad lib, voiding, tolerating PO and + flatus. Baby breast feeding well. No h/o PMAD  Objective: Blood pressure (!) 93/57, pulse 88, temperature 98.2 F (36.8 C), resp. rate 20, height 5\' 7"  (1.702 m), weight 86.9 kg, last menstrual period 04/30/2017, SpO2 99 %.  Physical Exam:  General: alert and no distress Uterine Fundus: firm, below U Laceration: deferred DVT Evaluation: No evidence of DVT seen on physical exam.  Recent Labs    01/25/18 1810 01/27/18 0541  HGB 13.0 11.3*  HCT 38.5 33.3*    Assessment/Plan:  Pt OOB, moving well without pain, well appearing and happy. Partner at [place] with infant. Will use NFP/condoms for contraception. Plans another baby<3years. Talked about PMAD, s/s infection, breastfeeding tips, and when to call the office.  Discharge home and Breastfeeding    LOS: 3 days   Cynthia Day 01/28/2018, 10:51 AM

## 2018-01-28 NOTE — Discharge Summary (Signed)
OB Discharge Summary     Patient Name: Cynthia Day DOB: 08/16/1980 MRN: [id number]  Date of admission: 01/25/2018 Delivering MD: Waymon Amato   Date of discharge: 01/28/2018  Admitting diagnosis: 38.5 WKS, BLOODY SHOW, CTXS Intrauterine pregnancy: [redacted]w[redacted]d     Secondary diagnosis:  Active Problems:   Normal labor and delivery   Normal vaginal delivery  Additional problems: none     Discharge diagnosis: Term Pregnancy Delivered                                                                                                Post partum procedures:none  Augmentation: Pitocin  Complications: None  Hospital course:  Onset of Labor With Vaginal Delivery     37 y.o. yo G1P0 at [redacted]w[redacted]d was admitted in Latent Labor on 01/25/2018. Patient had an uncomplicated labor course as follows:  Membrane Rupture Time/Date: 4:00 PM ,01/25/2018   Intrapartum Procedures: Episiotomy: None [1]                                         Lacerations:  Perineal [11];Labial [10]  Patient had a delivery of a Viable infant. 01/26/2018  Information for the patient's newborn:  [place], [place] [[id number]]  Delivery Method: Vag-Spont    Pateint had an uncomplicated postpartum course.  She is ambulating, tolerating a regular diet, passing flatus, and urinating well. Patient is discharged home in stable condition on 01/28/18.   Physical exam  Vitals:   01/27/18 0516 01/27/18 1500 01/27/18 2159 01/28/18 0600  BP: (!) 91/57 107/66 108/74 (!) 93/57  Pulse: 86 80 76 88  Resp: 16 19 20 20   Temp:  97.7 F (36.5 C) 97.7 F (36.5 C) 98.2 F (36.8 C)  TempSrc:  Oral    SpO2:   100% 99%  Weight:      Height:        Labs: Lab Results  Component Value Date   WBC 18.5 (H) 01/27/2018   HGB 11.3 (L) 01/27/2018   HCT 33.3 (L) 01/27/2018   MCV 86.7 01/27/2018   PLT 210 01/27/2018   No flowsheet data found.  Discharge instruction: per After Visit Summary and "Baby and Me Booklet".  After visit meds:   Allergies as of 01/28/2018   No Known Allergies     Medication List    TAKE these medications   fluticasone 50 MCG/ACT nasal spray Commonly known as:  FLONASE Place 2 sprays into both nostrils daily.   ibuprofen 600 MG tablet Commonly known as:  ADVIL,MOTRIN Take 1 tablet (600 mg total) by mouth every 6 (six) hours.   MELATONIN PO Take 1 tablet by mouth at bedtime.   PRENATAL VITAMIN PO Take by mouth.       Diet: routine diet  Activity: Advance as tolerated. Pelvic rest for 6 weeks.   Outpatient follow up:4 weeks Follow up Appt: Future Appointments  Date Time Provider [place]  06/11/2018  9:15 AM Glendale Chard, MD [facility] None   Follow up Visit:No  follow-ups on file.  Postpartum contraception: Natural Family Planning, Condoms  Newborn Data: Live born female  Birth Weight: 8 lb 0.6 oz (3645 g) APGAR: 9, 9  Newborn Delivery   Birth date/time:  01/[birth year]/2018 12:52:00 Delivery type:  Vaginal, Spontaneous     Baby Feeding: breast Disposition:home with mother   01/28/2018 Violet Baldy Casondra Gasca, [name]

## 2018-03-16 ENCOUNTER — Other Ambulatory Visit: Payer: Self-pay

## 2018-03-16 DIAGNOSIS — Z0100 Encounter for examination of eyes and vision without abnormal findings: Secondary | ICD-10-CM

## 2018-04-28 ENCOUNTER — Encounter (HOSPITAL_COMMUNITY): Payer: Self-pay

## 2018-05-07 ENCOUNTER — Ambulatory Visit: Payer: Self-pay

## 2018-05-07 NOTE — Lactation Note (Signed)
This note was copied from a baby's chart. 05/07/2018  Name: Cynthia Day MRN: [id number] Date of Birth: 01/26/2017 Gestational Age: Gestational Age: [redacted]w[redacted]d Birth Weight: 128.6 oz Weight today:    15 pounds 0.9 ounces (6830 grams) with clean size 1 diaper    63 month old infant presents today with mom for feeding assessment. Mom has developed a milk bleb to the center of the right nipple for the last month. Mom reports infant clicks on the right breast and it started in the last month and a half.   Infant self awakens every 2-4 hours to feed. She generally feeds on one breast per feeding. Mom pumps once a day for dad to feed infant a bottle at night. Infant drools in the Medela bottle and does better on the Mam nipple. Mom is aware of paced bottle feeding.   Mom has had a milk bleb on her right nipple that has been occurring for the last month. Mom using warm compresses, Olive Oil Coconut oil, Lanolin to relieve bleb. Mom reports it was better and then got worse when she went out of town and was not able to use the warm compresses and keep up the treatment. The bleb is currently opened. Discussed APNO with mom and that it may be helpful to help her heal.   Infant fed on the right breast for the feeding for about 10 minutes, nipple was asymmetrical and compressed post feeding. Mom with pain with initial latch that improved with feeding. Infant clicking and some choking with feeding. Mom reports infant usually does not choke on the breast. Mom reports infant drools on the Medela bottles but does ok on the Mam bottles.   Mom reports she did not notice if her nipple was asymmetrical prior to the last few weeks. Mom reports the clicking started in the last 1.5 months with infant. Infant is sometimes distracted and pulls on and off the breast with some head turning also.   Infant to follow up with Lactation as needed. Mom to call with any questions as needed. 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     General Information: Mother's reason for visit: nipple pain to right nipple Consult: Initial Lactation consultant: Nonah Mattes RN,IBCLC Breastfeeding experience: self awakens to feed every 2-4 hours   Maternal medications: Pre-natal vitamin, Other(Lecithin 12oo mg TID, Elderberry)  Breastfeeding History: Frequency of breast feeding: every 2-4 hours Duration of feeding: 10-15 minutes  Supplementation: Supplement method: bottle(Mam, Medela)         Breast milk volume: 2-3 ounces Breast milk frequency: 1x a day   Pump type: Medela pump in style Pump frequency: once a day Pump volume: 2-5 ounces  Infant Output Assessment: Voids per 24 hours: 8 Urine color: Clear yellow Stools per 24 hours: 3-4 Stool color: Yellow  Breast Assessment: Breast: Soft, Compressible Nipple: Erect, Blister(right nipple) Pain level: 8(with the initial latch and improves with feeding) Pain interventions: Bra, Coconut oil, Expressed breast milk  Feeding Assessment: Infant oral assessment: Variance Infant oral assessment comment: Infant latched to the right breast. Infant active and pulling on and off the breast at times. Infant clicking at the breast. Infant with thin labial frenulum that inserts at the bottom of the gum ridge, upper lip is tight with flanging. Infant with sucking blister to center upper lip. Infant with posterior lingual frenulum noted. Infant with good tongue lateralization and extension. Infant  with some limited mid tongue elevation. Infant with hump to the back of the tongue with suckling on gloved finger. Infant with strong suckle on gloved finger, some tongue thrusting noted. Mom give information on tongue and lip restrictions and how they can effect milk supply and milk production. Infant is gaining well and mom's milk supply is good. mom to research and speak with dad to determine if they want to  have infant evaluated.  Positioning: Cradle(right breast) Latch: 2 - Grasps breast easily, tongue down, lips flanged, rhythmical sucking. Audible swallowing: 2 - Spontaneous and intermittent Type of nipple: 2 - Everted at rest and after stimulation Comfort: 1 - Filling, red/small blisters or bruises, mild/mod discomfort Hold: 2 - No assistance needed to correctly position infant at breast LATCH score: 9 Latch assessment: Shallow Lips flanged: Yes Suck assessment: Displays both   Pre-feed weight: 6830 grams Post feed weight: 6924 grams Amount transferred: 94 ml Amount supplemented: 0  Additional Feeding Assessment:                                    Totals: Total amount transferred: 94 ml Total supplement given: 0 Total amount pumped post feed: did not pump   Plan:  1. Offer infant the breast with feeding cues 2. Empty the first before offering the second breast 3. Sunflower Lecithin can be helpful to decrease milk blebs. Lecithin dosage is usually 1200 mg QID and then decrease to 2 x a day. 4. All Purpose Nipple Ointment may be help to heal your right nipple 5. Keep up the good work 6. Thank you for allowing me to assist you today 7. Please call with any questions/concerns as needed [phone] 8. Follow up with Lactation as needed   Donn Pierini RN, IBCLC                                                        Donn Pierini 05/07/2018, 3:37 PM

## 2018-06-08 ENCOUNTER — Telehealth: Payer: Self-pay | Admitting: General Practice

## 2018-06-08 NOTE — Telephone Encounter (Signed)
PT CALLED TO CANCEL DUE TO SHE IS IN [facility] NETWORK UNDER INSURANCE. CALLED PT TO CONFIRM CANCELLATION NO ANS UNABLE TO LVM

## 2018-06-11 ENCOUNTER — Encounter: Payer: Self-pay | Admitting: Internal Medicine

## 2019-04-09 NOTE — L&D Delivery Note (Signed)
Delivery Note At 2:09 AM a viable and healthy female was delivered via SVD (Presentation: Right Occiput Anterior).   APGAR: 9, 9;  weight pending Placenta status: Spontaneous, Intact to pathology Cord: 3 vessels with the following complications: None.    Anesthesia: Epidural Episiotomy: None Lacerations: None Suture Repair: NA Est. Blood Loss (mL):  100 mL  Pt does desire inpatient circumcision  Mom to postpartum.  Baby to Couplet care / Skin to Skin.  Anderson Malta B Erandy Mceachern 01/02/2020, 2:27 AM

## 2019-07-28 ENCOUNTER — Other Ambulatory Visit (HOSPITAL_COMMUNITY): Payer: Self-pay | Admitting: Obstetrics and Gynecology

## 2019-07-28 DIAGNOSIS — O09522 Supervision of elderly multigravida, second trimester: Secondary | ICD-10-CM

## 2019-07-28 DIAGNOSIS — Z3689 Encounter for other specified antenatal screening: Secondary | ICD-10-CM

## 2019-07-28 DIAGNOSIS — Z3A20 20 weeks gestation of pregnancy: Secondary | ICD-10-CM

## 2019-08-16 ENCOUNTER — Encounter: Payer: Self-pay | Admitting: *Deleted

## 2019-08-18 ENCOUNTER — Ambulatory Visit: Payer: No Typology Code available for payment source

## 2019-10-03 ENCOUNTER — Inpatient Hospital Stay (HOSPITAL_COMMUNITY)
Admission: AD | Admit: 2019-10-03 | Discharge: 2019-10-04 | Disposition: A | Discharge status: Home / Self Care | Payer: PRIVATE HEALTH INSURANCE | Attending: Obstetrics and Gynecology | Admitting: Obstetrics and Gynecology

## 2019-10-03 ENCOUNTER — Encounter (HOSPITAL_COMMUNITY): Payer: Self-pay | Admitting: Obstetrics and Gynecology

## 2019-10-03 ENCOUNTER — Other Ambulatory Visit: Payer: Self-pay

## 2019-10-03 DIAGNOSIS — B9689 Other specified bacterial agents as the cause of diseases classified elsewhere: Secondary | ICD-10-CM | POA: Diagnosis not present

## 2019-10-03 DIAGNOSIS — O4692 Antepartum hemorrhage, unspecified, second trimester: Secondary | ICD-10-CM | POA: Diagnosis present

## 2019-10-03 DIAGNOSIS — O99612 Diseases of the digestive system complicating pregnancy, second trimester: Secondary | ICD-10-CM | POA: Diagnosis not present

## 2019-10-03 DIAGNOSIS — Z3689 Encounter for other specified antenatal screening: Secondary | ICD-10-CM

## 2019-10-03 DIAGNOSIS — O4690 Antepartum hemorrhage, unspecified, unspecified trimester: Secondary | ICD-10-CM

## 2019-10-03 DIAGNOSIS — K5900 Constipation, unspecified: Secondary | ICD-10-CM | POA: Insufficient documentation

## 2019-10-03 DIAGNOSIS — O09522 Supervision of elderly multigravida, second trimester: Secondary | ICD-10-CM | POA: Insufficient documentation

## 2019-10-03 DIAGNOSIS — Z3A27 27 weeks gestation of pregnancy: Secondary | ICD-10-CM | POA: Diagnosis not present

## 2019-10-03 DIAGNOSIS — O23592 Infection of other part of genital tract in pregnancy, second trimester: Secondary | ICD-10-CM | POA: Diagnosis not present

## 2019-10-03 NOTE — MAU Provider Note (Signed)
History     CSN: [id number]  Arrival date and time: 10/03/19 2244   First Provider Initiated Contact with Patient 10/03/19 2321      Chief Complaint  Patient presents with  . Vaginal Bleeding   Cynthia Day is a 39 y.o. G2P1 at [redacted]w[redacted]d who presents to MAU with complaints of vaginal bleeding. Patient reports getting out of the shower tonight and noticing bright red vaginal bleeding on the towel. Patient reports that she continued to wipe and bleeding continued. She denies passing any clots. She denies recent IC in the past 24-48 hours. She denies vaginal discharge prior to the bleeding starting. She denies abdominal pain, cramping or contractions. Patient reports earlier having constipation pain that was intermittent but no contraction like pain. Patient denies BM today and she denies straining. +FM.   OB History    Gravida  2   Para  1   Term  1   Preterm      AB      Living  1     SAB      TAB      Ectopic      Multiple      Live Births  1           Past Medical History:  Diagnosis Date  . Murmur    Dr. Charlesetta Garibaldi  . Vaginal Pap smear, abnormal     Past Surgical History:  Procedure Laterality Date  . LEEP    . none      Family History  Problem Relation Age of Onset  . Coronary artery disease Father 49  . Hypertension Mother     Social History   Tobacco Use  . Smoking status: Never Smoker  . Smokeless tobacco: Never Used  Substance Use Topics  . Alcohol use: Not Currently    Alcohol/week: 0.0 standard drinks  . Drug use: No    Allergies: No Known Allergies  Medications Prior to Admission  Medication Sig Dispense Refill Last Dose  . cetirizine (ZYRTEC) 10 MG chewable tablet Chew 10 mg by mouth daily.   10/02/2019 at Unknown time  . fluticasone (FLONASE) 50 MCG/ACT nasal spray Place 2 sprays into both nostrils daily.   10/02/2019 at Unknown time  . Prenatal Vit-Fe Fumarate-FA (PRENATAL VITAMIN PO) Take by mouth.   10/02/2019 at Unknown time     Review of Systems  Constitutional: Negative.   Respiratory: Negative.   Cardiovascular: Negative.   Gastrointestinal: Negative.   Genitourinary: Positive for vaginal bleeding. Negative for difficulty urinating, dysuria, frequency, hematuria, urgency and vaginal discharge.  Musculoskeletal: Negative.   Neurological: Negative.   Psychiatric/Behavioral: Negative.    Physical Exam   Blood pressure 113/70, pulse (!) 101, temperature 98 F (36.7 C), temperature source Oral, resp. rate 18, last menstrual period 03/29/2019, SpO2 100 %, unknown if currently breastfeeding.  Physical Exam Exam conducted with a chaperone present.  HENT:     Head: Normocephalic.  Cardiovascular:     Rate and Rhythm: Normal rate and regular rhythm.  Pulmonary:     Effort: Pulmonary effort is normal. No respiratory distress.     Breath sounds: Normal breath sounds. No wheezing.  Abdominal:     Palpations: Abdomen is soft. There is no mass.     Tenderness: There is no abdominal tenderness. There is no guarding.     Comments: Gravid appropriate for gestational age, no contractions palpated   Genitourinary:    Exam position: Lithotomy position.     Vagina:  Vaginal discharge present. No bleeding.     Comments: Pelvic exam: Cervix pink, visually open to 1cm, without lesion, moderate white creamy discharge, no vaginal bleeding present, vaginal walls and external genitalia normal Skin:    General: Skin is warm and dry.  Neurological:     Mental Status: She is alert and oriented to person, place, and time.  Psychiatric:        Mood and Affect: Mood normal.        Behavior: Behavior normal.        Thought Content: Thought content normal.    Patient had recent anatomy US on 09/10/19- normal anatomy US with posterior placenta, cervical examination performed today in MAU d/t no vaginal bleeding on examination and posterior placenta on anatomy US.   Dilation: Fingertip Effacement (%): Thick Cervical Position:  Posterior Exam by:: V Allie Gerhold [name]  Fetal monitoring:  FHR- 145/ moderate/+accels (10x10)/ no decelerations  TOCO- 1 UC    MAU Course  Procedures  MDM Orders Placed This Encounter  Procedures  . Wet prep, genital  . [place] MFM OB LIMITED  . Fetal fibronectin   Labs reviewed:  Results for orders placed or performed during the hospital encounter of 10/03/19 (from the past 24 hour(s))  Wet prep, genital     Status: Abnormal   Collection Time: 10/03/19 11:39 PM  Result Value Ref Range   Yeast Wet Prep HPF POC NONE SEEN NONE SEEN   Trich, Wet Prep NONE SEEN NONE SEEN   Clue Cells Wet Prep HPF POC PRESENT (A) NONE SEEN   WBC, Wet Prep HPF POC MANY (A) NONE SEEN   Sperm NONE SEEN   Fetal fibronectin     Status: None   Collection Time: 10/03/19 11:51 PM  Result Value Ref Range   Fetal Fibronectin NEGATIVE NEGATIVE   [place] report reviewed:  - fundal placenta, no signs of previa or abruption - cephalic presentation - AFV WNL  - FHR 150  Reviewed labs and ultrasound with patient. Discussed with patient diagnoses of BV based on physical examination and presence of clue cells. Reviewed pelvic for 7 days after occurrence of vaginal bleeding. Rx for Flagyl sent to pharmacy of choice. RH pos, no Rhogam needed.    Discussed reasons to return to MAU. Follow up as scheduled in the office. Return to MAU as needed. Pt stable at time of discharge.   Assessment and Plan   1. Bacterial vaginosis   2. Vaginal bleeding during pregnancy   3. [redacted] weeks gestation of pregnancy   4. NST (non-stress test) reactive    Discharge home Follow up as scheduled in the office for prenatal care Return to MAU as needed for reasons discussed and/or emergencies  Rx for flagyl  NST reactive and reassuring for gestational age  No vaginal bleeding on examination, pelvic rest and hydration    Follow-up Information    [facility] [place]. Go on 10/11/2019.   Specialty: Obstetrics and Gynecology Why: Follow up as  scheduled for prenatal care and return to MAU as needed Contact information: [address]. [address] [place] [address] [phone]              Allergies as of 10/04/2019   No Known Allergies     Medication List    TAKE these medications   cetirizine 10 MG chewable tablet Commonly known as: ZYRTEC Chew 10 mg by mouth daily.   fluticasone 50 MCG/ACT nasal spray Commonly known as: FLONASE Place 2 sprays into  both nostrils daily.   metroNIDAZOLE 500 MG tablet Commonly known as: FLAGYL Take 1 tablet (500 mg total) by mouth 2 (two) times daily.   PRENATAL VITAMIN PO Take by mouth.       Lajean Manes [name] 10/04/2019, 4:31 AM

## 2019-10-03 NOTE — MAU Note (Signed)
Patient reports to MAU for VB that happened when she was drying off after a shower. The blood was bright in color and kept coming as she wiped repeatedly. +movement

## 2019-10-04 ENCOUNTER — Inpatient Hospital Stay (HOSPITAL_BASED_OUTPATIENT_CLINIC_OR_DEPARTMENT_OTHER): Payer: PRIVATE HEALTH INSURANCE

## 2019-10-04 DIAGNOSIS — Z3A27 27 weeks gestation of pregnancy: Secondary | ICD-10-CM | POA: Diagnosis not present

## 2019-10-04 DIAGNOSIS — O4692 Antepartum hemorrhage, unspecified, second trimester: Secondary | ICD-10-CM | POA: Diagnosis not present

## 2019-10-04 DIAGNOSIS — B9689 Other specified bacterial agents as the cause of diseases classified elsewhere: Secondary | ICD-10-CM

## 2019-10-04 DIAGNOSIS — O99891 Other specified diseases and conditions complicating pregnancy: Secondary | ICD-10-CM

## 2019-10-04 DIAGNOSIS — O4693 Antepartum hemorrhage, unspecified, third trimester: Secondary | ICD-10-CM

## 2019-10-04 DIAGNOSIS — N760 Acute vaginitis: Secondary | ICD-10-CM

## 2019-10-04 LAB — WET PREP, GENITAL
Sperm: NONE SEEN
Trich, Wet Prep: NONE SEEN
Yeast Wet Prep HPF POC: NONE SEEN

## 2019-10-04 LAB — GC/CHLAMYDIA PROBE AMP (~~LOC~~) NOT AT ARMC
Chlamydia: NEGATIVE
Comment: NEGATIVE
Comment: NORMAL
Neisseria Gonorrhea: NEGATIVE

## 2019-10-04 LAB — FETAL FIBRONECTIN: Fetal Fibronectin: NEGATIVE

## 2019-10-04 MED ORDER — METRONIDAZOLE 500 MG PO TABS: 500.0000 mg | ORAL_TABLET | Freq: Two times a day (BID) | ORAL | 0 refills | Status: DC

## 2019-10-04 NOTE — Discharge Instructions (Signed)
Pelvic rest for the next 7 days

## 2019-12-30 ENCOUNTER — Telehealth (HOSPITAL_COMMUNITY): Payer: Self-pay | Admitting: *Deleted

## 2019-12-31 ENCOUNTER — Other Ambulatory Visit: Payer: Self-pay | Admitting: Obstetrics and Gynecology

## 2019-12-31 ENCOUNTER — Encounter (HOSPITAL_COMMUNITY): Payer: Self-pay | Admitting: *Deleted

## 2019-12-31 NOTE — Telephone Encounter (Signed)
Preadmission screen  

## 2020-01-01 ENCOUNTER — Inpatient Hospital Stay (HOSPITAL_COMMUNITY): Payer: PRIVATE HEALTH INSURANCE | Admitting: Anesthesiology

## 2020-01-01 ENCOUNTER — Inpatient Hospital Stay (HOSPITAL_COMMUNITY)
Admission: AD | Admit: 2020-01-01 | Discharge: 2020-01-03 | DRG: 807 | Disposition: A | Discharge status: Home / Self Care | Payer: PRIVATE HEALTH INSURANCE | Attending: Obstetrics & Gynecology | Admitting: Obstetrics & Gynecology

## 2020-01-01 ENCOUNTER — Other Ambulatory Visit: Payer: Self-pay

## 2020-01-01 ENCOUNTER — Encounter (HOSPITAL_COMMUNITY): Payer: Self-pay | Admitting: Obstetrics and Gynecology

## 2020-01-01 DIAGNOSIS — O26893 Other specified pregnancy related conditions, third trimester: Secondary | ICD-10-CM | POA: Diagnosis present

## 2020-01-01 DIAGNOSIS — O479 False labor, unspecified: Secondary | ICD-10-CM

## 2020-01-01 DIAGNOSIS — Z20822 Contact with and (suspected) exposure to covid-19: Secondary | ICD-10-CM | POA: Diagnosis present

## 2020-01-01 DIAGNOSIS — Z3A39 39 weeks gestation of pregnancy: Secondary | ICD-10-CM | POA: Diagnosis not present

## 2020-01-01 DIAGNOSIS — E669 Obesity, unspecified: Secondary | ICD-10-CM | POA: Diagnosis present

## 2020-01-01 DIAGNOSIS — O99214 Obesity complicating childbirth: Secondary | ICD-10-CM | POA: Diagnosis present

## 2020-01-01 LAB — CBC
HCT: 39 % (ref 36.0–46.0)
Hemoglobin: 12.4 g/dL (ref 12.0–15.0)
MCH: 27.7 pg (ref 26.0–34.0)
MCHC: 31.8 g/dL (ref 30.0–36.0)
MCV: 87.2 fL (ref 80.0–100.0)
Platelets: 207 10*3/uL (ref 150–400)
RBC: 4.47 MIL/uL (ref 3.87–5.11)
RDW: 14 % (ref 11.5–15.5)
WBC: 11.5 10*3/uL — ABNORMAL HIGH (ref 4.0–10.5)
nRBC: 0 % (ref 0.0–0.2)

## 2020-01-01 LAB — TYPE AND SCREEN
ABO/RH(D): A POS
Antibody Screen: NEGATIVE

## 2020-01-01 LAB — RESPIRATORY PANEL BY RT PCR (FLU A&B, COVID)
Influenza A by PCR: NEGATIVE
Influenza B by PCR: NEGATIVE
SARS Coronavirus 2 by RT PCR: NEGATIVE

## 2020-01-01 MED ORDER — OXYTOCIN BOLUS FROM INFUSION
333.0000 mL | Freq: Once | INTRAVENOUS | Status: AC
  Administered 2020-01-02: 333 mL via INTRAVENOUS

## 2020-01-01 MED ORDER — LACTATED RINGERS IV SOLN
500.0000 mL | Freq: Once | INTRAVENOUS | Status: AC
  Administered 2020-01-01: 500 mL via INTRAVENOUS

## 2020-01-01 MED ORDER — OXYTOCIN-SODIUM CHLORIDE 30-0.9 UT/500ML-% IV SOLN
1.0000 m[IU]/min | INTRAVENOUS | Status: DC
  Administered 2020-01-01: 2 m[IU]/min via INTRAVENOUS
  Filled 2020-01-01: qty 500

## 2020-01-01 MED ORDER — BUTORPHANOL TARTRATE 1 MG/ML IJ SOLN
1.0000 mg | INTRAMUSCULAR | Status: DC | PRN
  Administered 2020-01-01: 1 mg via INTRAVENOUS
  Filled 2020-01-01: qty 1

## 2020-01-01 MED ORDER — LIDOCAINE HCL (PF) 1 % IJ SOLN: 30.0000 mL | INTRAMUSCULAR | Status: DC | PRN

## 2020-01-01 MED ORDER — OXYTOCIN-SODIUM CHLORIDE 30-0.9 UT/500ML-% IV SOLN
2.5000 [IU]/h | INTRAVENOUS | Status: DC
  Administered 2020-01-02: 2.5 [IU]/h via INTRAVENOUS

## 2020-01-01 MED ORDER — FENTANYL-BUPIVACAINE-NACL 0.5-0.125-0.9 MG/250ML-% EP SOLN
12.0000 mL/h | EPIDURAL | Status: DC | PRN
  Filled 2020-01-01: qty 250

## 2020-01-01 MED ORDER — EPHEDRINE 5 MG/ML INJ: 10.0000 mg | INTRAVENOUS | Status: DC | PRN

## 2020-01-01 MED ORDER — FENTANYL CITRATE (PF) 100 MCG/2ML IJ SOLN
INTRAMUSCULAR | Status: AC
  Filled 2020-01-01: qty 2

## 2020-01-01 MED ORDER — LIDOCAINE HCL (PF) 1 % IJ SOLN
INTRAMUSCULAR | Status: DC | PRN
  Administered 2020-01-01: 10 mL via EPIDURAL
  Administered 2020-01-01: 2 mL via EPIDURAL

## 2020-01-01 MED ORDER — ONDANSETRON HCL 4 MG/2ML IJ SOLN
4.0000 mg | Freq: Four times a day (QID) | INTRAMUSCULAR | Status: DC | PRN
  Administered 2020-01-01: 4 mg via INTRAVENOUS
  Filled 2020-01-01: qty 2

## 2020-01-01 MED ORDER — ACETAMINOPHEN 325 MG PO TABS: 650.0000 mg | ORAL_TABLET | ORAL | Status: DC | PRN

## 2020-01-01 MED ORDER — LACTATED RINGERS IV SOLN
500.0000 mL | INTRAVENOUS | Status: DC | PRN
  Administered 2020-01-02: 500 mL via INTRAVENOUS

## 2020-01-01 MED ORDER — HYDROXYZINE HCL 50 MG PO TABS: 50.0000 mg | ORAL_TABLET | Freq: Four times a day (QID) | ORAL | Status: DC | PRN

## 2020-01-01 MED ORDER — PHENYLEPHRINE 40 MCG/ML (10ML) SYRINGE FOR IV PUSH (FOR BLOOD PRESSURE SUPPORT): 80.0000 ug | PREFILLED_SYRINGE | INTRAVENOUS | Status: DC | PRN

## 2020-01-01 MED ORDER — LACTATED RINGERS IV SOLN: INTRAVENOUS | Status: DC

## 2020-01-01 MED ORDER — FENTANYL CITRATE (PF) 2500 MCG/50ML IJ SOLN
INTRAMUSCULAR | Status: DC | PRN
Start: 2020-01-01 — End: 2020-01-02
  Administered 2020-01-01: 12 mL/h via EPIDURAL

## 2020-01-01 MED ORDER — DIPHENHYDRAMINE HCL 50 MG/ML IJ SOLN: 12.5000 mg | INTRAMUSCULAR | Status: DC | PRN

## 2020-01-01 MED ORDER — SOD CITRATE-CITRIC ACID 500-334 MG/5ML PO SOLN: 30.0000 mL | ORAL | Status: DC | PRN

## 2020-01-01 NOTE — Progress Notes (Signed)
Roshan Hannold is a 39 y.o. G2P1001 at [redacted]w[redacted]d Admitted for spontaneous labor AMA  Subjective Pt now comfortable with epidural  Objective: BP 109/70   Pulse 78   Temp 98.3 F (36.8 C) (Oral)   Resp 16   Ht 5\' 7"  (1.702 m)   Wt 88.9 kg   LMP 03/29/2019   BMI 30.70 kg/m  No intake/output data recorded. Total I/O In: 325 [Other:325] Out: -   FHT:  FHR: 140 bpm, variability: moderate,  accelerations:  Present,  decelerations:  Absent UC:   irregular, every 5 minutes SVE:   Dilation: 5 Effacement (%): 90 Station: -2 Exam by:: [employer], [name] AROM clear 2153 Labs: Lab Results  Component Value Date   WBC 11.5 (H) 01/01/2020   HGB 12.4 01/01/2020   HCT 39.0 01/01/2020   MCV 87.2 01/01/2020   PLT 207 01/01/2020    Assessment / Plan: Spontaneous labor, progressing normally  Labor: AROM complete, begin pitocin augmentation. Dr Landry Mellow at bedside  Preeclampsia:  no signs or symptoms of toxicity Fetal Wellbeing:  Category I Pain Control:  Epidural I/D:  n/a Anticipated MOD:  NSVD  Marveen Reeks Roslynn Holte 01/01/2020, 9:56 PM

## 2020-01-01 NOTE — MAU Note (Signed)
Pt rports ctx since yesterday. Have gotten stronger and closer together about 5 min apart. Reports some wetness but not really leaking and no bleeding. Good fetal movement felt. 2cm in offcie on THursday.

## 2020-01-01 NOTE — H&P (Signed)
Cynthia Day is a 39 y.o. female presenting for active labor She was seen in the MAU, sve 5/60/-3, painful contractions. Denies vb or lof. Reports adequate fetal movement.  OB History    Gravida  2   Para  1   Term  1   Preterm      AB      Living  1     SAB      TAB      Ectopic      Multiple      Live Births  1          Past Medical History:  Diagnosis Date   Murmur    Dr. Charlesetta Garibaldi   Vaginal Pap smear, abnormal    Past Surgical History:  Procedure Laterality Date   LEEP     NEUROFIBROMA EXCISION     L arm   none     Family History: family history includes Coronary artery disease (age of onset: 60) in her father; Hypertension in her mother. Social History:  reports that she has never smoked. She has never used smokeless tobacco. She reports previous alcohol use. She reports that she does not use drugs.     Maternal Diabetes: No Genetic Screening: Normal Maternal Ultrasounds/Referrals: Normal Fetal Ultrasounds or other Referrals:  None Maternal Substance Abuse:  No Significant Maternal Medications:  None Significant Maternal Lab Results:  Group B Strep negative Other Comments:  None  Review of Systems  Constitutional: Negative.   HENT: Negative.   Eyes: Negative.   Respiratory: Negative.   Cardiovascular: Negative.   Gastrointestinal: Negative.   Endocrine: Negative.   Genitourinary: Negative.   Musculoskeletal: Negative.   Skin: Negative.   Allergic/Immunologic: Negative.   Neurological: Negative.   Hematological: Negative.   Psychiatric/Behavioral: Negative.    History Dilation: 5 Effacement (%): 60 Station: -3 Exam by:: Erasmo Score RN Blood pressure 129/77, temperature 98.4 F (36.9 C), resp. rate (!) 96, last menstrual period 03/29/2019, unknown if currently breastfeeding. Exam Physical Exam Vitals reviewed.  Constitutional:      Appearance: Normal appearance.  HENT:     Head: Normocephalic and atraumatic.      Nose: Nose normal.     Mouth/Throat:     Mouth: Mucous membranes are moist.     Pharynx: Oropharynx is clear.  Eyes:     Extraocular Movements: Extraocular movements intact.     Conjunctiva/sclera: Conjunctivae normal.     Pupils: Pupils are equal, round, and reactive to light.  Cardiovascular:     Rate and Rhythm: Normal rate and regular rhythm.     Pulses: Normal pulses.  Pulmonary:     Effort: Pulmonary effort is normal.     Breath sounds: Normal breath sounds.  Abdominal:     Palpations: Abdomen is soft.  Musculoskeletal:        General: Normal range of motion.     Cervical back: Normal range of motion and neck supple.  Skin:    General: Skin is warm and dry.  Neurological:     General: No focal deficit present.     Mental Status: She is alert and oriented to person, place, and time.  Psychiatric:        Mood and Affect: Mood normal.        Behavior: Behavior normal.        Thought Content: Thought content normal.        Judgment: Judgment normal.    Cat I FHR tracing  Prenatal  labs: ABO, Rh:   A pos Antibody:   Neg Rubella:   Immune RPR:   Neg HBsAg:   Neg HIV:   Neg GBS:   Neg  Assessment/Plan: Admit for active labor May have epidural if desires Anticipate SVD Dr. Landry Mellow aware of admission Marveen Reeks Brytani Voth 01/01/2020, 5:40 PM

## 2020-01-01 NOTE — Anesthesia Procedure Notes (Signed)
Epidural Patient location during procedure: OB Start time: 01/01/2020 6:46 PM End time: 01/01/2020 6:57 PM  Staffing Anesthesiologist: Pervis Hocking, DO Performed: anesthesiologist   Preanesthetic Checklist Completed: patient identified, IV checked, risks and benefits discussed, monitors and equipment checked, pre-op evaluation and timeout performed  Epidural Patient position: sitting Prep: DuraPrep and site prepped and draped Patient monitoring: continuous pulse ox, blood pressure, heart rate and cardiac monitor Approach: midline Location: L3-L4 Injection technique: LOR air  Needle:  Needle type: Tuohy  Needle gauge: 17 G Needle length: 9 cm Needle insertion depth: 7 cm Catheter type: closed end flexible Catheter size: 19 Gauge Catheter at skin depth: 12 cm Test dose: negative  Assessment Sensory level: T8 Events: blood not aspirated, injection not painful, no injection resistance, no paresthesia and negative IV test  Additional Notes Patient identified. Risks/Benefits/Options discussed with patient including but not limited to bleeding, infection, nerve damage, paralysis, failed block, incomplete pain control, headache, blood pressure changes, nausea, vomiting, reactions to medication both or allergic, itching and postpartum back pain. Confirmed with bedside nurse the patient's most recent platelet count. Confirmed with patient that they are not currently taking any anticoagulation, have any bleeding history or any family history of bleeding disorders. Patient expressed understanding and wished to proceed. All questions were answered. Sterile technique was used throughout the entire procedure. Please see nursing notes for vital signs. Test dose was given through epidural catheter and negative prior to continuing to dose epidural or start infusion. Warning signs of high block given to the patient including shortness of breath, tingling/numbness in hands, complete motor  block, or any concerning symptoms with instructions to call for help. Patient was given instructions on fall risk and not to get out of bed. All questions and concerns addressed with instructions to call with any issues or inadequate analgesia.  Reason for block:procedure for pain

## 2020-01-01 NOTE — Anesthesia Preprocedure Evaluation (Addendum)
Anesthesia Evaluation  Patient identified by MRN, date of birth, ID band Patient awake    Reviewed: Allergy & Precautions, Patient's Chart, lab work & pertinent test results  Airway Mallampati: II  TM Distance: >3 FB Neck ROM: Full    Dental no notable dental hx.    Pulmonary neg pulmonary ROS,    Pulmonary exam normal breath sounds clear to auscultation       Cardiovascular Normal cardiovascular exam+ Valvular Problems/Murmurs (benign murmur this pregnancy)  Rhythm:Regular Rate:Normal     Neuro/Psych negative neurological ROS  negative psych ROS   GI/Hepatic negative GI ROS, Neg liver ROS,   Endo/Other  Obesity BMI 31  Renal/GU negative Renal ROS  negative genitourinary   Musculoskeletal negative musculoskeletal ROS (+)   Abdominal   Peds negative pediatric ROS (+)  Hematology negative hematology ROS (+) hct 39, plt 207   Anesthesia Other Findings covid pending   Reproductive/Obstetrics (+) Pregnancy G2P1, had prior vaginal delivery with epidural                            Anesthesia Physical Anesthesia Plan  ASA: II and emergent  Anesthesia Plan: Epidural   Post-op Pain Management:    Induction:   PONV Risk Score and Plan: 2  Airway Management Planned: Natural Airway  Additional Equipment: None  Intra-op Plan:   Post-operative Plan:   Informed Consent: I have reviewed the patients History and Physical, chart, labs and discussed the procedure including the risks, benefits and alternatives for the proposed anesthesia with the patient or authorized representative who has indicated his/her understanding and acceptance.       Plan Discussed with:   Anesthesia Plan Comments:         Anesthesia Quick Evaluation

## 2020-01-01 NOTE — Progress Notes (Signed)
Cynthia Day is a 39 y.o. G2P1001 at [redacted]w[redacted]d Admitted for spontaneous labor AMA  Subjective Pt not obtaining relief from epidural  Objective: BP 109/70   Pulse 78   Temp 98.3 F (36.8 C) (Oral)   Resp 16   Ht 5\' 7"  (1.702 m)   Wt 88.9 kg   LMP 03/29/2019   BMI 30.70 kg/m  No intake/output data recorded. Total I/O In: 325 [Other:325] Out: -   FHT:  FHR: 140 bpm, variability: moderate,  accelerations:  Present,  decelerations:  Absent UC:   irregular, every 5 minutes SVE:   Dilation: 5 Effacement (%): 60 Station: -3 Exam by:: Ramiro Harvest, RN  Labs: Lab Results  Component Value Date   WBC 11.5 (H) 01/01/2020   HGB 12.4 01/01/2020   HCT 39.0 01/01/2020   MCV 87.2 01/01/2020   PLT 207 01/01/2020    Assessment / Plan: Spontaneous labor, progressing normally  Labor: Requested Anesthesia for reevaluation of epidural. Will AROM and begin pitocin augmentation once comfortable Preeclampsia:  no signs or symptoms of toxicity Fetal Wellbeing:  Category I Pain Control:  Epidural I/D:  n/a Anticipated MOD:  NSVD  Howell Groesbeck B Kaidan Spengler 01/01/2020, 9:23 PM

## 2020-01-02 ENCOUNTER — Encounter (HOSPITAL_COMMUNITY): Payer: Self-pay | Admitting: Obstetrics & Gynecology

## 2020-01-02 LAB — CBC
HCT: 37.4 % (ref 36.0–46.0)
Hemoglobin: 12.5 g/dL (ref 12.0–15.0)
MCH: 29.1 pg (ref 26.0–34.0)
MCHC: 33.4 g/dL (ref 30.0–36.0)
MCV: 87.2 fL (ref 80.0–100.0)
Platelets: 199 10*3/uL (ref 150–400)
RBC: 4.29 MIL/uL (ref 3.87–5.11)
RDW: 14.1 % (ref 11.5–15.5)
WBC: 16.9 10*3/uL — ABNORMAL HIGH (ref 4.0–10.5)
nRBC: 0 % (ref 0.0–0.2)

## 2020-01-02 LAB — RPR: RPR Ser Ql: NONREACTIVE

## 2020-01-02 MED ORDER — ONDANSETRON HCL 4 MG PO TABS: 4.0000 mg | ORAL_TABLET | ORAL | Status: DC | PRN

## 2020-01-02 MED ORDER — DIBUCAINE (PERIANAL) 1 % EX OINT: 1.0000 "application " | TOPICAL_OINTMENT | CUTANEOUS | Status: DC | PRN

## 2020-01-02 MED ORDER — SIMETHICONE 80 MG PO CHEW: 80.0000 mg | CHEWABLE_TABLET | ORAL | Status: DC | PRN

## 2020-01-02 MED ORDER — OXYCODONE-ACETAMINOPHEN 5-325 MG PO TABS: 1.0000 | ORAL_TABLET | ORAL | Status: DC | PRN

## 2020-01-02 MED ORDER — COCONUT OIL OIL
1.0000 "application " | TOPICAL_OIL | Status: DC | PRN
  Administered 2020-01-02: 1 via TOPICAL

## 2020-01-02 MED ORDER — ZOLPIDEM TARTRATE 5 MG PO TABS: 5.0000 mg | ORAL_TABLET | Freq: Every evening | ORAL | Status: DC | PRN

## 2020-01-02 MED ORDER — ONDANSETRON HCL 4 MG/2ML IJ SOLN: 4.0000 mg | INTRAMUSCULAR | Status: DC | PRN

## 2020-01-02 MED ORDER — SENNOSIDES-DOCUSATE SODIUM 8.6-50 MG PO TABS
2.0000 | ORAL_TABLET | ORAL | Status: DC
  Administered 2020-01-02: 2 via ORAL
  Filled 2020-01-02: qty 2

## 2020-01-02 MED ORDER — DIPHENHYDRAMINE HCL 25 MG PO CAPS: 25.0000 mg | ORAL_CAPSULE | Freq: Four times a day (QID) | ORAL | Status: DC | PRN

## 2020-01-02 MED ORDER — TETANUS-DIPHTH-ACELL PERTUSSIS 5-2.5-18.5 LF-MCG/0.5 IM SUSP: 0.5000 mL | Freq: Once | INTRAMUSCULAR | Status: DC

## 2020-01-02 MED ORDER — PRENATAL MULTIVITAMIN CH
1.0000 | ORAL_TABLET | Freq: Every day | ORAL | Status: DC
  Filled 2020-01-02: qty 1

## 2020-01-02 MED ORDER — WITCH HAZEL-GLYCERIN EX PADS: 1.0000 "application " | MEDICATED_PAD | CUTANEOUS | Status: DC | PRN

## 2020-01-02 MED ORDER — ACETAMINOPHEN 325 MG PO TABS
650.0000 mg | ORAL_TABLET | ORAL | Status: DC | PRN
  Filled 2020-01-02: qty 2

## 2020-01-02 MED ORDER — BENZOCAINE-MENTHOL 20-0.5 % EX AERO
1.0000 "application " | INHALATION_SPRAY | CUTANEOUS | Status: DC | PRN
  Administered 2020-01-02: 1 via TOPICAL
  Filled 2020-01-02: qty 56

## 2020-01-02 MED ORDER — IBUPROFEN 600 MG PO TABS
600.0000 mg | ORAL_TABLET | Freq: Four times a day (QID) | ORAL | Status: DC
  Administered 2020-01-02 – 2020-01-03 (×6): 600 mg via ORAL
  Filled 2020-01-02 (×6): qty 1

## 2020-01-02 NOTE — Anesthesia Postprocedure Evaluation (Signed)
Anesthesia Post Note  Patient: Cynthia Day  Procedure(s) Performed: AN AD HOC LABOR EPIDURAL     Patient location during evaluation: Mother Baby Anesthesia Type: Epidural Level of consciousness: awake and alert, oriented and patient cooperative Pain management: pain level controlled Vital Signs Assessment: post-procedure vital signs reviewed and stable Respiratory status: spontaneous breathing, respiratory function stable and nonlabored ventilation Cardiovascular status: blood pressure returned to baseline Postop Assessment: no headache, no backache, epidural receding, no apparent nausea or vomiting, patient able to bend at knees, able to ambulate and adequate PO intake Anesthetic complications: no   No complications documented.  Last Vitals:  Vitals:   01/02/20 0445 01/02/20 0600  BP: 109/66 101/63  Pulse: 89 85  Resp: 16 18  Temp: 37.6 C 36.9 C  SpO2:  99%    Last Pain:  Vitals:   01/02/20 0600  TempSrc: Axillary  PainSc: 0-No pain   Pain Goal:                   [place] Smoker

## 2020-01-02 NOTE — Lactation Note (Signed)
This note was copied from a baby's chart. Lactation Consultation Note  Patient Name: Boy Kelliann Griebel DTOIZ'T Date: 01/02/2020 Reason for consult: Initial assessment   P2, Ex BF for 14 mos.  Mother can easily hand express.  Baby cueing.  Noted baby has short anterior lingual frenulum. Discussed with parents and suggest they discuss with Ped MD.   Provided information sheet. Observed latch with intermittent swallows and no pain. Reviewed basics. Mom made aware of O/P services, breastfeeding support groups, community resources, and our phone # for post-discharge questions.     Maternal Data Has patient been taught Hand Expression?: Yes Does the patient have breastfeeding experience prior to this delivery?: Yes  Feeding Feeding Type: Breast Fed  LATCH Score Latch: Grasps breast easily, tongue down, lips flanged, rhythmical sucking.  Audible Swallowing: Spontaneous and intermittent  Type of Nipple: Everted at rest and after stimulation  Comfort (Breast/Nipple): Soft / non-tender  Hold (Positioning): No assistance needed to correctly position infant at breast.  LATCH Score: 10  Interventions Interventions: Breast feeding basics reviewed;Skin to skin;Support pillows  Lactation Tools Discussed/Used     Consult Status Consult Status: Follow-up Date: 01/03/20 Follow-up type: In-patient    Vivianne Master [facility] 01/02/2020, 3:12 PM

## 2020-01-03 ENCOUNTER — Other Ambulatory Visit (HOSPITAL_COMMUNITY): Admission: RE | Admit: 2020-01-03 | Payer: PRIVATE HEALTH INSURANCE | Source: Ambulatory Visit

## 2020-01-03 MED ORDER — IBUPROFEN 600 MG PO TABS: 600.0000 mg | ORAL_TABLET | Freq: Four times a day (QID) | ORAL | 0 refills | Status: AC

## 2020-01-03 MED ORDER — ACETAMINOPHEN 325 MG PO TABS: 650.0000 mg | ORAL_TABLET | ORAL | Status: AC | PRN

## 2020-01-03 NOTE — Social Work (Signed)
CSW received consult for hx of Anxiety and Depression.  CSW met with MOB to offer support and complete assessment.    CSW introduced self and role. Baby was not present, due to being in a procedure. MOB declined having FOB leave the room for privacy during the assessment. CSW asked MOB about her mental health history. MOB stated she was diagnosed with depression at around 30 weeks. MOB stated she was prescribed Zoloft but stopped taking it after 4-5 days, due to it causing nausea and heartburn. MOB stated she still has the prescription for Zoloft. MOB disclosed she attends therapy and finds it to be helpful. MOB expressed the depression is now mild. CSW asked MOB about her support system. MOB stated aside from FOB, her aunt, mother and mother-in-law are all supports. MOB expressed each person has a role in providing support to herself and baby.  CSW provided education regarding the baby blues period vs. perinatal mood disorders, discussed treatment and gave resources for mental health follow up if concerns arise.  CSW recommends self-evaluation during the postpartum time period using the New Mom Checklist from Postpartum Progress and encouraged MOB to contact a medical professional if symptoms are noted at any time.   CSW provided review of Sudden Infant Death Syndrome (SIDS) precautions.   MOB stated expressed she has all of the essential needs for baby to discharge home, including a carseat.   MOB expressed no additional needs or concerns at this time. CSW identifies no further need for intervention and no barriers to discharge at this time.  Darra Lis, [place] Worker [facility] and [employer]

## 2020-01-03 NOTE — Discharge Summary (Signed)
Postpartum Discharge Summary  Date of Service updated 01/03/20    Patient Name: Cynthia Day DOB: 30-Mar-1981 MRN: [id number]  Date of admission: 01/01/2020 Delivery date:01/02/2020  Delivering provider: CRUMPLER, Anderson Malta B  Date of discharge: 01/03/2020  Admitting diagnosis: Normal labor [O80, Z37.9] Intrauterine pregnancy: [redacted]w[redacted]d    Secondary diagnosis:  Principal Problem:   Postpartum care following vaginal delivery 9/26 Active Problems:   Normal labor   SVD (spontaneous vaginal delivery) 9/26  Additional problems: none    Discharge diagnosis: Term Pregnancy Delivered                                              Post partum procedures:none Augmentation: AROM and Pitocin Complications: None  Hospital course: Onset of Labor With Vaginal Delivery      39y.o. yo [id number] admitted in Active Labor on 01/01/2020. Patient had an uncomplicated labor course as follows:  Membrane Rupture Time/Date: 9:53 PM ,01/01/2020   Delivery Method:Vaginal, Spontaneous  Episiotomy: None  Lacerations:  None  Patient had an uncomplicated postpartum course.  She is ambulating, tolerating a regular diet, passing flatus, and urinating well. Patient is discharged home in stable condition on 01/03/20.  Newborn Data: Birth date:01/02/2020  Birth time:2:09 AM  Gender:Female  Living status:Living  Apgars:9 ,9  Weight:3830 g   Magnesium Sulfate received: No BMZ received: Yes Rhophylac:N/A MMR:N/A Transfusion:No  Physical exam  Vitals:   01/02/20 1305 01/02/20 1800 01/02/20 2100 01/03/20 0420  BP: 103/73 113/68 112/75 107/69  Pulse: 81 73 80 73  Resp: 18 18 18 16   Temp: 98.2 F (36.8 C) 98.3 F (36.8 C) 98.2 F (36.8 C) 97.8 F (36.6 C)  TempSrc: Axillary Axillary Axillary Oral  SpO2: 100% 100% 99% 100%  Weight:      Height:       General: alert, cooperative and no distress Lochia: appropriate Uterine Fundus: firm Incision: N/A DVT Evaluation: No evidence of DVT seen on  physical exam. No cords or calf tenderness. No significant calf/ankle edema. Labs: Lab Results  Component Value Date   WBC 16.9 (H) 01/02/2020   HGB 12.5 01/02/2020   HCT 37.4 01/02/2020   MCV 87.2 01/02/2020   PLT 199 01/02/2020   No flowsheet data found. Edinburgh Score: Edinburgh Postnatal Depression Scale Screening Tool 01/02/2020  I have been able to laugh and see the funny side of things. 0  I have looked forward with enjoyment to things. 1  I have blamed myself unnecessarily when things went wrong. 1  I have been anxious or worried for no good reason. 1  I have felt scared or panicky for no good reason. 0  Things have been getting on top of me. 0  I have been so unhappy that I have had difficulty sleeping. 0  I have felt sad or miserable. 1  I have been so unhappy that I have been crying. 1  The thought of harming myself has occurred to me. 0  Edinburgh Postnatal Depression Scale Total 5      After visit meds:  Allergies as of 01/03/2020   No Known Allergies     Medication List    STOP taking these medications   metroNIDAZOLE 500 MG tablet Commonly known as: FLAGYL     TAKE these medications   acetaminophen 325 MG tablet Commonly known as: Tylenol Take 2 tablets (650  mg total) by mouth every 4 (four) hours as needed (for pain scale < 4).   cetirizine 10 MG chewable tablet Commonly known as: ZYRTEC Chew 10 mg by mouth daily.   fluticasone 50 MCG/ACT nasal spray Commonly known as: FLONASE Place 2 sprays into both nostrils daily.   ibuprofen 600 MG tablet Commonly known as: ADVIL Take 1 tablet (600 mg total) by mouth every 6 (six) hours.   PRENATAL VITAMIN PO Take by mouth.        Discharge home in stable condition Infant Feeding: Breast Infant Disposition:home with mother Discharge instruction: per After Visit Summary and Postpartum booklet. Activity: Advance as tolerated. Pelvic rest for 6 weeks.  Diet: routine diet Anticipated Birth  Control: spouse plans vasectomy Postpartum Appointment:6 weeks Additional Postpartum F/U: none Future Appointments:No future appointments. Follow up Visit:  [facility]. Schedule an appointment as soon as possible for a visit in 6 week(s).   Specialty: Obstetrics and Gynecology Contact information: [address] [place] [address] [phone]                  01/03/2020 Arrie Eastern, [name]

## 2020-01-03 NOTE — Lactation Note (Signed)
This note was copied from a baby's chart. Lactation Consultation Note  Patient Name: Cynthia Day [id number] Date: 01/03/2020 Reason for consult: Follow-up assessment;Term;Infant weight loss;Other (Comment) (5 % weight loss / post frentomy ( anterior ) by Dr. Owens Shark per [place])  Baby is 89 hours old.  As LC entered the room , per mom baby just finished feeding for 10 mins and it felt more comfortable and he was not chomping like it had been prior to the frenotomy. Per mom mentioned her nipples are sore especially on the right.  LC offered to assess and not cracking at the upper portion of the base of the nipple, and tiny intact blister on the nipple. The left nipple clear. LC noted some areola edema when mom was hand expressing.  LC provided breast shells for 10 mins prior to feed or between feedings except when sleeping alternating with comfort gels x 6 days without coconut oil mom already has.  LC recommended since the right nipple is cracked at the base to use the shells sparely on the right until healed. EBM liberally.  Sore nipple and engorgement prevention and tx reviewed.  LC also provided mom with a hand pump with #24 F and #27 F with instructions to keep it simple when she goes home due to the weight loss only 5 %.  LC encouraged to call [facility] with feeding cues.  Baby is having a circ at 1500 and planning a late D/C.    Maternal Data Has patient been taught Hand Expression?: Yes  Feeding Feeding Type:  (baby had recently  fed 10 mins after Cynthia Day - and is asleep)  LATCH Score                   Interventions Interventions: Breast feeding basics reviewed;Shells;Comfort gels;Coconut oil;Hand pump  Lactation Tools Discussed/Used Tools: Shells;Pump;Flanges;Coconut oil;Comfort gels Flange Size: 24;27 Shell Type: Inverted Breast pump type: Manual WIC Program: No Pump Review: Milk Storage;Setup, frequency, and cleaning   Consult Status Consult Status:  Complete Date: 01/03/20    Myer Haff 01/03/2020, 11:35 AM

## 2020-01-04 ENCOUNTER — Inpatient Hospital Stay (HOSPITAL_COMMUNITY): Payer: PRIVATE HEALTH INSURANCE

## 2020-01-04 ENCOUNTER — Inpatient Hospital Stay (HOSPITAL_COMMUNITY)
Admission: AD | Admit: 2020-01-04 | Payer: PRIVATE HEALTH INSURANCE | Source: Home / Self Care | Admitting: Obstetrics and Gynecology

## 2020-01-05 LAB — SURGICAL PATHOLOGY

## 2021-02-13 IMAGING — US US MFM OB LIMITED
1 series · 13 of 13 positions shown · non-contrast
Comparison: none

[Series 1: us mfm ob limited · 13 acquisitions, 13 frames shown]
[im 1/13]
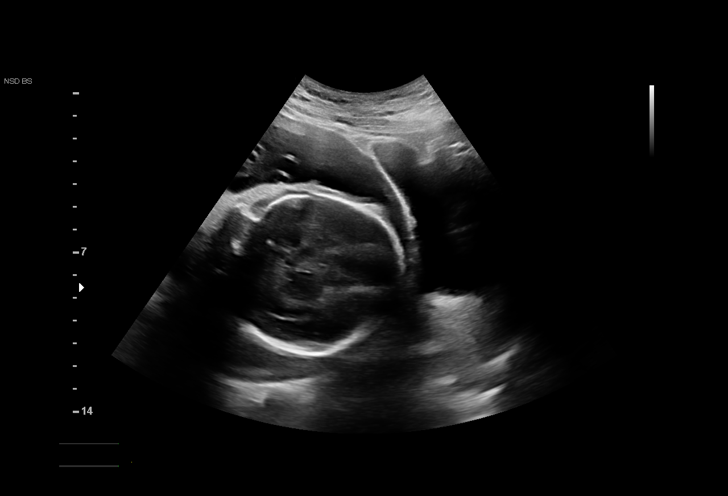
[im 2/13]
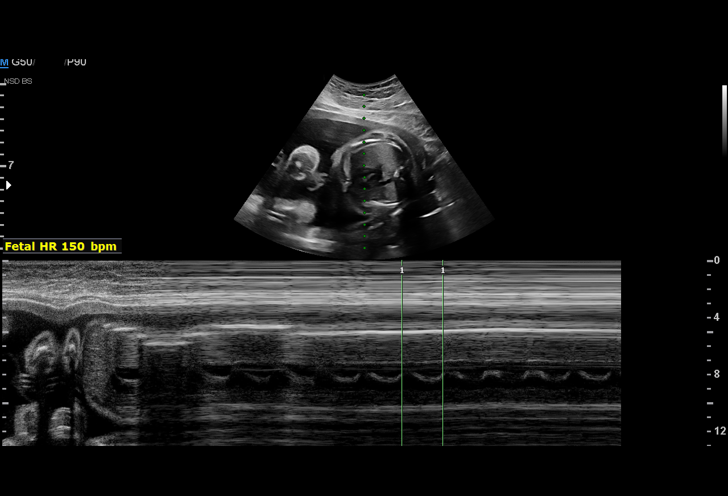
[im 3/13]
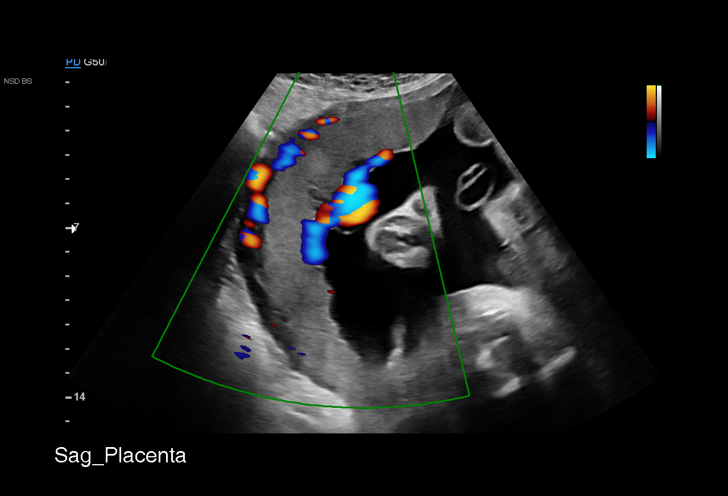
[im 4/13]
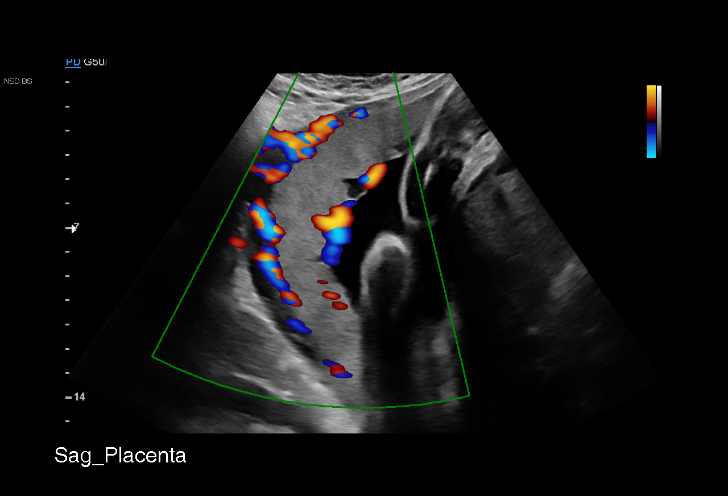
[im 5/13]
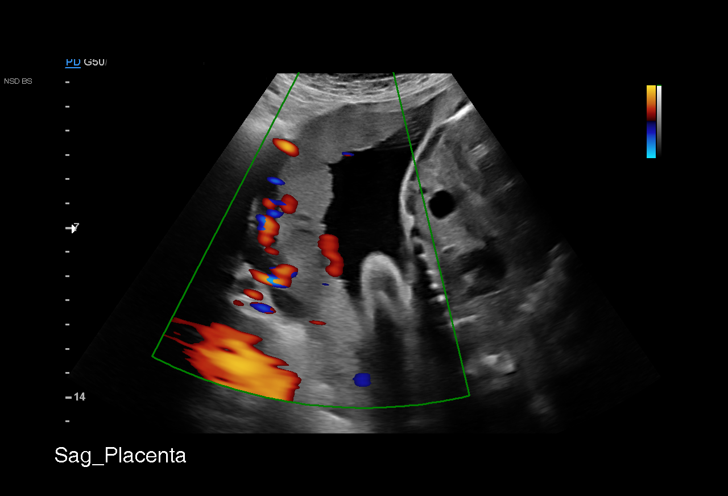
[im 6/13]
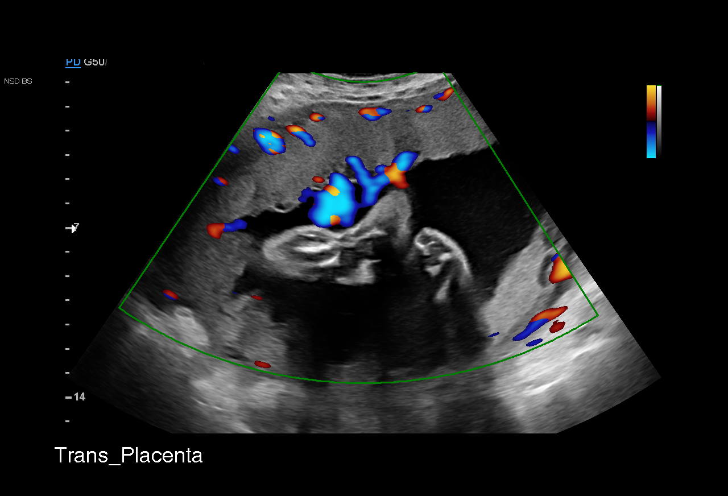
[im 7/13]
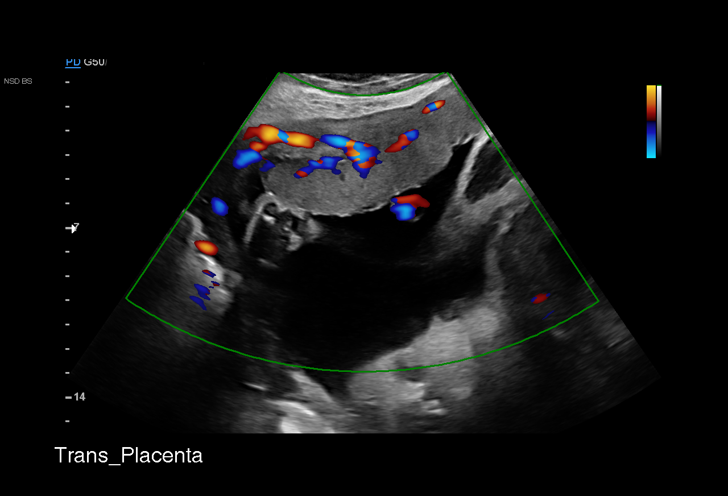
[im 8/13]
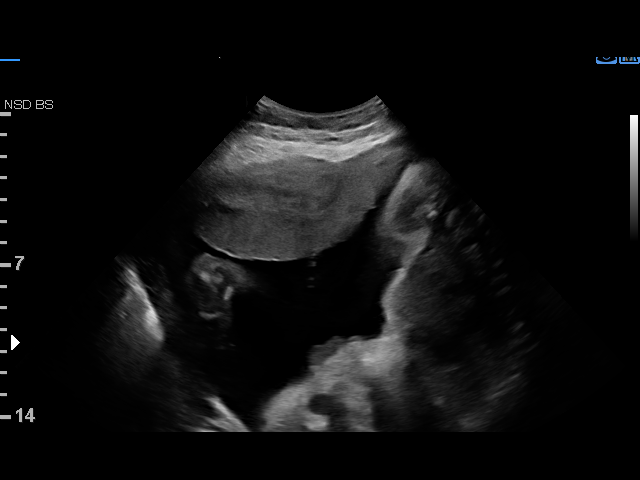
[im 9/13]
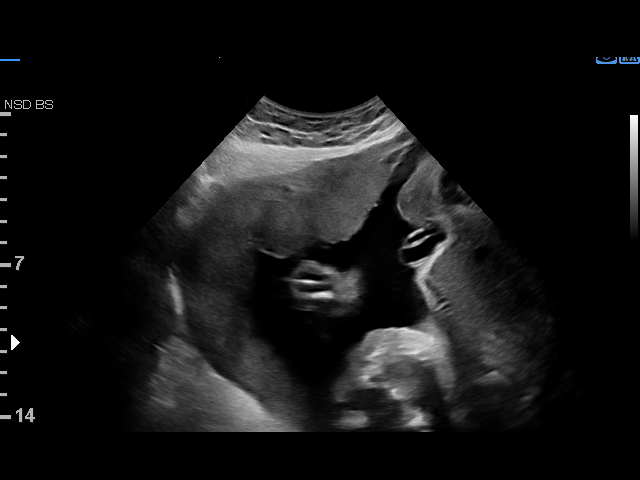
[im 10/13]
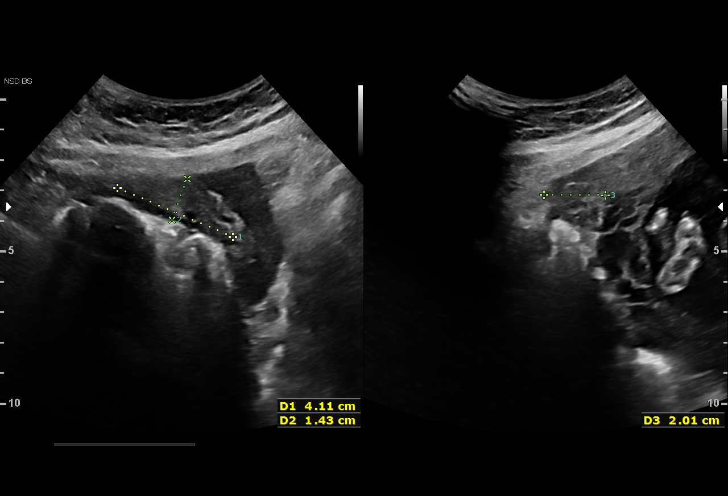
[im 11/13]
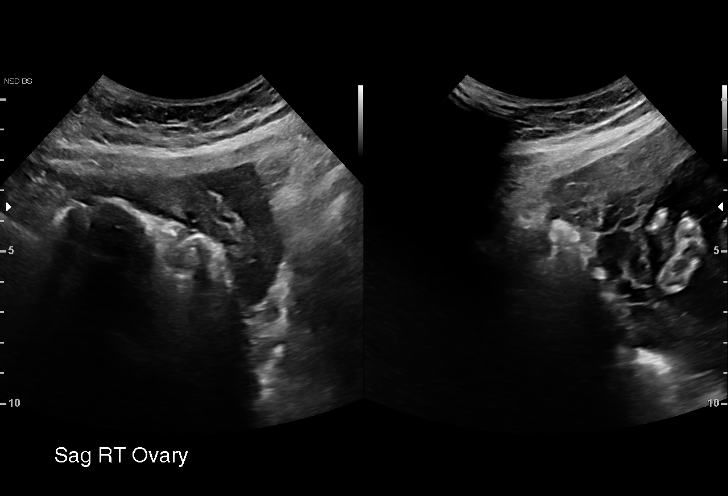
[im 12/13]
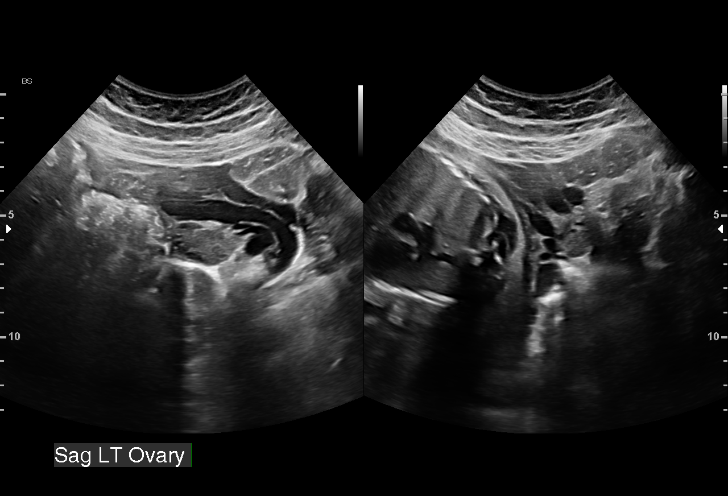
[im 13/13]
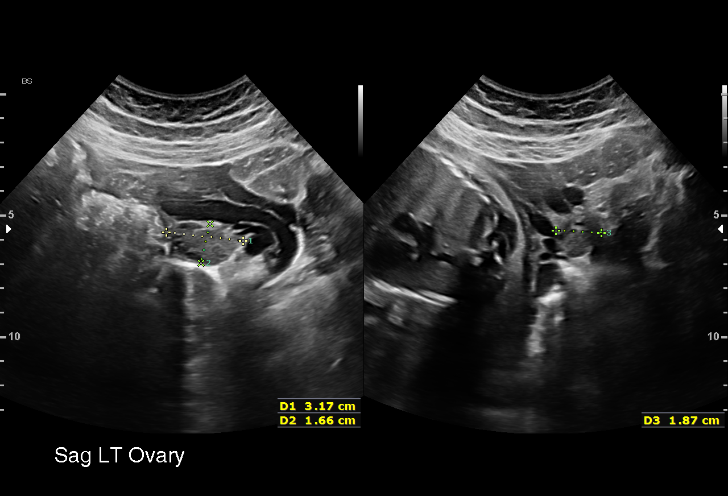

[13 of 13 positions shown; findings below may reference images not displayed]

[facility] [name]                                [HOSPITAL]

 1  US MFM OB LIMITED                     76815.01    VAUGHAN UMANZOR

Indications

 Vaginal bleeding in pregnancy, second
 trimester
 27 weeks gestation of pregnancy
Fetal Evaluation

 Num Of Fetuses:          1
 Fetal Heart Rate(bpm):   150
 Cardiac Activity:        Observed
 Presentation:            Cephalic
 Placenta:                Fundal
 P. Cord Insertion:       Visualized

 Amniotic Fluid
 AFI FV:      Within normal limits

 AFI Sum(cm)     %Tile       Largest Pocket(cm)
 24.1            97

 RUQ(cm)       RLQ(cm)       LUQ(cm)        LLQ(cm)


 Comment:    No placental abruption or previa identified.
OB History

 Gravidity:    2         Term:   1        Prem:   0        SAB:   0
 TOP:          0       Ectopic:  0        Living: 1
Gestational Age

 LMP:           27w 0d        Date:  03/29/19                 EDD:   01/03/20
 Best:          27w 0d     Det. By:  LMP  (03/29/19)          EDD:   01/03/20
Cervix Uterus Adnexa

 Cervix
 Not visualized (advanced GA >60wks)

 Right Ovary
 Within normal limits.

 Left Ovary
 Within normal limits.

 Adnexa
 No abnormality visualized.
Impression

 Limited exam to assess maternal vaginal bleeding
 Good fetal movement and amniotic fluid
 No evidence of placental abruption or previa
Recommendations

 Follow up as clinically indicated
